# Patient Record
Sex: Male | Born: 1990 | Race: Black or African American | Hispanic: No | Marital: Single | Smoking: Never smoker
Health system: Southern US, Community
[De-identification: ages and names within clinical notes are randomized; demographics above are authoritative.]

## PROBLEM LIST (undated history)

## (undated) ENCOUNTER — Ambulatory Visit (HOSPITAL_COMMUNITY): Discharge status: Absent without leave | Payer: BC Managed Care – PPO

## (undated) ENCOUNTER — Ambulatory Visit (HOSPITAL_COMMUNITY): Source: Home / Self Care

## (undated) ENCOUNTER — Emergency Department (HOSPITAL_COMMUNITY): Payer: No Typology Code available for payment source

## (undated) DIAGNOSIS — I10 Essential (primary) hypertension: Secondary | ICD-10-CM

---
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
## 2009-03-09 ENCOUNTER — Emergency Department (HOSPITAL_COMMUNITY): Admission: EM | Admit: 2009-03-09 | Discharge: 2009-03-09 | Payer: Self-pay | Admitting: Emergency Medicine

## 2010-01-19 ENCOUNTER — Emergency Department (HOSPITAL_COMMUNITY): Admission: EM | Admit: 2010-01-19 | Discharge: 2010-01-19 | Payer: Self-pay | Admitting: Emergency Medicine

## 2010-01-26 ENCOUNTER — Emergency Department (HOSPITAL_COMMUNITY): Admission: EM | Admit: 2010-01-26 | Discharge: 2010-01-26 | Payer: Self-pay | Admitting: Emergency Medicine

## 2010-04-24 ENCOUNTER — Emergency Department (HOSPITAL_COMMUNITY): Admission: EM | Admit: 2010-04-24 | Discharge: 2010-04-24 | Payer: Self-pay | Admitting: Family Medicine

## 2010-06-29 ENCOUNTER — Emergency Department (HOSPITAL_COMMUNITY): Admission: EM | Admit: 2010-06-29 | Discharge: 2009-08-22 | Payer: Self-pay | Admitting: Emergency Medicine

## 2010-08-17 ENCOUNTER — Emergency Department (HOSPITAL_COMMUNITY)
Admission: EM | Admit: 2010-08-17 | Discharge: 2010-08-17 | Payer: Self-pay | Source: Home / Self Care | Admitting: Emergency Medicine

## 2010-08-28 ENCOUNTER — Emergency Department (HOSPITAL_COMMUNITY)
Admission: EM | Admit: 2010-08-28 | Discharge: 2010-08-28 | Disposition: A | Discharge status: Home / Self Care | Payer: Medicaid Other | Attending: Emergency Medicine | Admitting: Emergency Medicine

## 2010-08-28 DIAGNOSIS — J45909 Unspecified asthma, uncomplicated: Secondary | ICD-10-CM | POA: Insufficient documentation

## 2010-08-28 DIAGNOSIS — M25519 Pain in unspecified shoulder: Secondary | ICD-10-CM | POA: Insufficient documentation

## 2010-08-28 DIAGNOSIS — I10 Essential (primary) hypertension: Secondary | ICD-10-CM | POA: Insufficient documentation

## 2010-09-06 ENCOUNTER — Emergency Department (HOSPITAL_COMMUNITY)
Admission: EM | Admit: 2010-09-06 | Discharge: 2010-09-06 | Disposition: A | Discharge status: Home / Self Care | Payer: Medicaid Other | Attending: Emergency Medicine | Admitting: Emergency Medicine

## 2010-09-06 DIAGNOSIS — R079 Chest pain, unspecified: Secondary | ICD-10-CM | POA: Insufficient documentation

## 2010-09-06 DIAGNOSIS — I10 Essential (primary) hypertension: Secondary | ICD-10-CM | POA: Insufficient documentation

## 2010-09-06 DIAGNOSIS — M25519 Pain in unspecified shoulder: Secondary | ICD-10-CM | POA: Insufficient documentation

## 2010-09-14 ENCOUNTER — Emergency Department (HOSPITAL_BASED_OUTPATIENT_CLINIC_OR_DEPARTMENT_OTHER)
Admission: EM | Admit: 2010-09-14 | Discharge: 2010-09-15 | Disposition: A | Discharge status: Home / Self Care | Payer: Medicaid Other | Attending: Emergency Medicine | Admitting: Emergency Medicine

## 2010-09-14 DIAGNOSIS — Y929 Unspecified place or not applicable: Secondary | ICD-10-CM | POA: Insufficient documentation

## 2010-09-14 DIAGNOSIS — G8929 Other chronic pain: Secondary | ICD-10-CM | POA: Insufficient documentation

## 2010-09-14 DIAGNOSIS — M79609 Pain in unspecified limb: Secondary | ICD-10-CM | POA: Insufficient documentation

## 2010-09-14 DIAGNOSIS — W208XXA Other cause of strike by thrown, projected or falling object, initial encounter: Secondary | ICD-10-CM | POA: Insufficient documentation

## 2010-09-14 DIAGNOSIS — I10 Essential (primary) hypertension: Secondary | ICD-10-CM | POA: Insufficient documentation

## 2010-09-14 DIAGNOSIS — S6000XA Contusion of unspecified finger without damage to nail, initial encounter: Secondary | ICD-10-CM | POA: Insufficient documentation

## 2010-09-15 ENCOUNTER — Emergency Department (INDEPENDENT_AMBULATORY_CARE_PROVIDER_SITE_OTHER): Payer: Medicaid Other

## 2010-09-15 DIAGNOSIS — S6710XA Crushing injury of unspecified finger(s), initial encounter: Secondary | ICD-10-CM

## 2010-09-15 DIAGNOSIS — W208XXA Other cause of strike by thrown, projected or falling object, initial encounter: Secondary | ICD-10-CM

## 2010-09-19 ENCOUNTER — Emergency Department (HOSPITAL_COMMUNITY)
Admission: EM | Admit: 2010-09-19 | Discharge: 2010-09-19 | Disposition: A | Discharge status: Home / Self Care | Payer: Medicaid Other | Attending: Emergency Medicine | Admitting: Emergency Medicine

## 2010-09-19 DIAGNOSIS — Z87828 Personal history of other (healed) physical injury and trauma: Secondary | ICD-10-CM | POA: Insufficient documentation

## 2010-09-19 DIAGNOSIS — M25519 Pain in unspecified shoulder: Secondary | ICD-10-CM | POA: Insufficient documentation

## 2010-10-05 LAB — POCT RAPID STREP A (OFFICE): Streptococcus, Group A Screen (Direct): NEGATIVE

## 2010-10-08 LAB — CBC
HCT: 40.6 % (ref 39.0–52.0)
Hemoglobin: 13.8 g/dL (ref 13.0–17.0)
MCHC: 33.9 g/dL (ref 30.0–36.0)
MCV: 88.6 fL (ref 78.0–100.0)
Platelets: 193 10*3/uL (ref 150–400)
RBC: 4.59 MIL/uL (ref 4.22–5.81)
RDW: 12.3 % (ref 11.5–15.5)
WBC: 5.9 10*3/uL (ref 4.0–10.5)

## 2010-10-08 LAB — URINE MICROSCOPIC-ADD ON

## 2010-10-08 LAB — DIFFERENTIAL
Basophils Absolute: 0.1 10*3/uL (ref 0.0–0.1)
Basophils Relative: 1 % (ref 0–1)
Eosinophils Absolute: 0.2 10*3/uL (ref 0.0–0.7)
Eosinophils Relative: 3 % (ref 0–5)
Lymphocytes Relative: 47 % — ABNORMAL HIGH (ref 12–46)
Lymphs Abs: 2.7 10*3/uL (ref 0.7–4.0)
Monocytes Absolute: 0.5 10*3/uL (ref 0.1–1.0)
Monocytes Relative: 8 % (ref 3–12)
Neutro Abs: 2.4 10*3/uL (ref 1.7–7.7)
Neutrophils Relative %: 41 % — ABNORMAL LOW (ref 43–77)

## 2010-10-08 LAB — BASIC METABOLIC PANEL
BUN: 11 mg/dL (ref 6–23)
CO2: 26 mEq/L (ref 19–32)
Calcium: 8.6 mg/dL (ref 8.4–10.5)
Chloride: 106 mEq/L (ref 96–112)
Creatinine, Ser: 1.05 mg/dL (ref 0.4–1.5)
GFR calc Af Amer: >60 mL/min (ref >60)
GFR calc non Af Amer: >60 mL/min (ref >60)
Glucose, Bld: 86 mg/dL (ref 70–99)
Potassium: 3.4 mEq/L — ABNORMAL LOW (ref 3.5–5.1)
Sodium: 137 mEq/L (ref 135–145)

## 2010-10-08 LAB — URINALYSIS, ROUTINE W REFLEX MICROSCOPIC
Hgb urine dipstick: NEGATIVE
Protein, ur: NEGATIVE mg/dL
Urobilinogen, UA: 1 mg/dL (ref 0.0–1.0)

## 2010-10-08 LAB — GC/CHLAMYDIA PROBE AMP, URINE: GC Probe Amp, Urine: NEGATIVE

## 2010-10-24 ENCOUNTER — Emergency Department (HOSPITAL_COMMUNITY): Payer: Self-pay

## 2010-10-24 ENCOUNTER — Emergency Department (HOSPITAL_COMMUNITY)
Admission: EM | Admit: 2010-10-24 | Discharge: 2010-10-24 | Disposition: A | Discharge status: Home / Self Care | Payer: Self-pay | Attending: Emergency Medicine | Admitting: Emergency Medicine

## 2010-10-24 DIAGNOSIS — R059 Cough, unspecified: Secondary | ICD-10-CM | POA: Insufficient documentation

## 2010-10-24 DIAGNOSIS — J069 Acute upper respiratory infection, unspecified: Secondary | ICD-10-CM | POA: Insufficient documentation

## 2010-10-24 DIAGNOSIS — R05 Cough: Secondary | ICD-10-CM | POA: Insufficient documentation

## 2010-11-01 ENCOUNTER — Emergency Department (HOSPITAL_COMMUNITY): Payer: Self-pay

## 2010-11-01 ENCOUNTER — Emergency Department (HOSPITAL_COMMUNITY)
Admission: EM | Admit: 2010-11-01 | Discharge: 2010-11-01 | Disposition: A | Discharge status: Home / Self Care | Payer: Self-pay | Attending: Emergency Medicine | Admitting: Emergency Medicine

## 2010-11-01 DIAGNOSIS — R05 Cough: Secondary | ICD-10-CM | POA: Insufficient documentation

## 2010-11-01 DIAGNOSIS — R059 Cough, unspecified: Secondary | ICD-10-CM | POA: Insufficient documentation

## 2010-11-01 DIAGNOSIS — R079 Chest pain, unspecified: Secondary | ICD-10-CM | POA: Insufficient documentation

## 2010-11-01 DIAGNOSIS — J069 Acute upper respiratory infection, unspecified: Secondary | ICD-10-CM | POA: Insufficient documentation

## 2010-11-01 DIAGNOSIS — I10 Essential (primary) hypertension: Secondary | ICD-10-CM | POA: Insufficient documentation

## 2010-12-05 NOTE — Consult Note (Signed)
[name], [name]              ACCOUNT NO.:  [id number]   MEDICAL RECORD NO.:  [id number]          PATIENT TYPE:  INP   LOCATION:  6126                         FACILITY:  [facility]   PHYSICIAN:  Antony Contras, MD     DATE OF BIRTH:  [birth year]   DATE OF CONSULTATION:  07/31/2007  DATE OF DISCHARGE:                                 CONSULTATION   CHIEF COMPLAINT:  Neck mass.   HISTORY OF PRESENT ILLNESS:  The patient is a 20 year old African  American male who has a 4-week history of increasing mass in the  submental region.  Initially it did not hurt but now it hurts some with  chewing.  He has had daily headaches during this time as well.  He came  to the emergency department 2 days ago and was prescribed clindamycin.  He returned yesterday because of fever to 102.2 degrees Fahrenheit as  well as some nausea and vomiting.  A head CT and neck CT were performed  and he was admitted to the pediatric service on IV clindamycin.  This  morning, he is without complaint and his headaches are better.   PAST MEDICAL HISTORY:  1. Hypertension.  2. Migraines.   FAMILY HISTORY:  Hypertension.   SOCIAL HISTORY:  The patient lives with his girlfriend and family.  He  denies alcohol, tobacco, drugs.   MEDICATIONS:  1. Clindamycin.  2. Ibuprofen.   ALLERGIES:  NO KNOWN DRUG ALLERGIES.   REVIEW OF SYSTEMS:  Negative except as listed above.   PHYSICAL EXAM:  Temperature max at 6:00 a.m. was 39.4 degrees Celsius  and current is 37 degrees Celsius.  Vital signs stable.  GENERAL:  The patient is in no acute distress and is pleasant and  cooperative.  He is alert.  HEAD AND FACE:  There are no abnormalities.  EARS:  External ears normal.  External canals are patent.  Tympanic  membranes are intact.  Middle ear spaces are aerated.  NOSE:  External nose is normal.  Nasal passages are patent.  Turbinates  are mildly enlarged.  Septum is relatively midline.  ORAL CAVITY:  The patient has normal  lips, teeth and gums.  The tonsils  are 1-2+.  The tongue is normal.  The floor of mouth is soft and normal.  CRANIAL NERVES:  II-XII are grossly intact.  NECK:  There is a 2 x 2 cm round, firm, nontender, mobile mass in the  midline submental region.  There is no other mass and other neck  landmarks are normal.  LYMPHATICS:  There are no enlarged lymph nodes besides the submental  mass.  THYROID: Thyroid is normal palpation.   LABORATORY DATA:  White blood count is 10.6.   RADIOLOGIC EXAM:  Neck CT with contrast performed yesterday was  personally reviewed.  This demonstrates a partially solid and possibly  partially fluid filled mass in the submental region is that is ill-  defined.  There is not a lot of inflammation surrounding it.  The rest  of the neck is normal.   ASSESSMENT:  The patient is a 20 year old African  American male with a  submental mass that is nontender and without signs of inflammation.  However, he has had fevers and has a little bit of discomfort with  chewing.   PLAN:  I discussed the plan with the pediatric service.  I recommended  continuing intravenous clindamycin until he is afebrile.  I would then  recommend an oral course of clindamycin.  If the mass persists in spite  of antibiotic therapy, biopsy will be performed most likely with a fine-  needle aspiration.  Excision may be needed for final pathology.  If the  mass shows more signs of inflammation over the next couple of days, a  repeat CT may be helpful to establish whether an abscess has formed.  Currently, he does not seem to have an abscess..  Follow-up will be with  me in about a week after discharge.      Antony Contras, MD  Electronically Signed     DDB/MEDQ  D:  07/31/2007  T:  07/31/2007  Job:  [id number]

## 2010-12-05 NOTE — Discharge Summary (Signed)
[name], [name]              ACCOUNT NO.:  [id number]   MEDICAL RECORD NO.:  [id number]          PATIENT TYPE:  INP   LOCATION:  6126                         FACILITY:  [facility]   PHYSICIAN:  Orie Rout, M.D.DATE OF BIRTH:  10/03/1990   DATE OF ADMISSION:  07/30/2007  DATE OF DISCHARGE:  08/02/2007                               DISCHARGE SUMMARY   REASON FOR HOSPITALIZATION:  Neck mass.   SIGNIFICANT PHYSICAL FINDINGS:  Head and neck CT on July 31, 2007,  showed a 2 x 2 cm polycystic partly solid mass in the submental region.  No other lymphadenopathy.  CBC showed a white count of 10.6 with 74%  neutrophils and 13% lymphocytes.  The patient was febrile on hospital  day #1 at 39.4 and has been afebrile while on clindamycin.  Treatment  included clindamycin  IV x48 hours, Tylenol  for pain and fever.  The  patient as switched to oral  clindamycin upon discharge.  The patient is  to follow up with Dr. Jenne Pane, who is an ENT specialist, on August 08, 2007, with consideration of a fine-needle aspiration if there is no  improvement in the mass.   OPERATION/PROCEDURE:  CAT scan.   FINAL DIAGNOSES:  Neck mass, still no specific diagnosis for the type of  neck mass.   DISCHARGE MEDICATIONS AND INSTRUCTIONS:  The patient was sent home on  clindamycin 450 mg p.o. t.i.d. x12 days.  The patient was instructed to  keep all of his followup appointments.   PENDING RESULTS OR ISSUES TO BE FOLLOWED UP:  None.   FOLLOWUP:  1. With Dr. Chilton Si, his dentist, on August 18, 2007, at 8:30 a.m.  2. With Dr. Jenne Pane, Ears, Nose, and Throat specialist, on August 08, 2007, at 12:45 p.m.  3. With Dr. Donnie Coffin, his pediatrician, on August 05, 2007, at 12:30      p.m.   DISCHARGE WEIGHT:  81 kg.   CONDITION ON DISCHARGE:  Stable.  This was faxed to primary care  physician, Dr. Donnie Coffin, at fax number [phone] on August 02, 2007.  This  was faxed to Dr. Jenne Pane at fax number [phone] on  August 02, 2007.      Asher Muir, MD  Electronically Signed      Orie Rout, M.D.  Electronically Signed    SO/MEDQ  D:  08/02/2007  T:  08/03/2007  Job:  [id number]   cc:   Jefferey Pica, MD  Antony Contras, MD

## 2010-12-30 ENCOUNTER — Emergency Department (HOSPITAL_COMMUNITY)
Admission: EM | Admit: 2010-12-30 | Discharge: 2010-12-30 | Discharge status: Against Medical Advice | Payer: Self-pay | Attending: Emergency Medicine | Admitting: Emergency Medicine

## 2010-12-30 DIAGNOSIS — R51 Headache: Secondary | ICD-10-CM | POA: Insufficient documentation

## 2010-12-30 DIAGNOSIS — R42 Dizziness and giddiness: Secondary | ICD-10-CM | POA: Insufficient documentation

## 2011-01-09 ENCOUNTER — Emergency Department (HOSPITAL_COMMUNITY)
Admission: EM | Admit: 2011-01-09 | Discharge: 2011-01-09 | Disposition: A | Discharge status: Home / Self Care | Payer: Self-pay | Attending: Emergency Medicine | Admitting: Emergency Medicine

## 2011-01-09 DIAGNOSIS — R51 Headache: Secondary | ICD-10-CM | POA: Insufficient documentation

## 2011-01-09 DIAGNOSIS — H53149 Visual discomfort, unspecified: Secondary | ICD-10-CM | POA: Insufficient documentation

## 2011-03-07 ENCOUNTER — Emergency Department (HOSPITAL_COMMUNITY)
Admission: EM | Admit: 2011-03-07 | Discharge: 2011-03-07 | Disposition: A | Discharge status: Home / Self Care | Payer: Self-pay | Attending: Emergency Medicine | Admitting: Emergency Medicine

## 2011-03-07 DIAGNOSIS — S61209A Unspecified open wound of unspecified finger without damage to nail, initial encounter: Secondary | ICD-10-CM | POA: Insufficient documentation

## 2011-03-07 DIAGNOSIS — W260XXA Contact with knife, initial encounter: Secondary | ICD-10-CM | POA: Insufficient documentation

## 2011-03-23 ENCOUNTER — Encounter: Payer: Self-pay | Admitting: Family Medicine

## 2011-03-23 ENCOUNTER — Emergency Department (HOSPITAL_BASED_OUTPATIENT_CLINIC_OR_DEPARTMENT_OTHER)
Admission: EM | Admit: 2011-03-23 | Discharge: 2011-03-23 | Disposition: A | Discharge status: Home / Self Care | Payer: No Typology Code available for payment source | Attending: Emergency Medicine | Admitting: Emergency Medicine

## 2011-03-23 ENCOUNTER — Emergency Department (INDEPENDENT_AMBULATORY_CARE_PROVIDER_SITE_OTHER): Payer: No Typology Code available for payment source

## 2011-03-23 DIAGNOSIS — Y92410 Unspecified street and highway as the place of occurrence of the external cause: Secondary | ICD-10-CM | POA: Insufficient documentation

## 2011-03-23 DIAGNOSIS — M542 Cervicalgia: Secondary | ICD-10-CM

## 2011-03-23 DIAGNOSIS — S139XXA Sprain of joints and ligaments of unspecified parts of neck, initial encounter: Secondary | ICD-10-CM | POA: Insufficient documentation

## 2011-03-23 DIAGNOSIS — I10 Essential (primary) hypertension: Secondary | ICD-10-CM | POA: Insufficient documentation

## 2011-03-23 HISTORY — DX: Essential (primary) hypertension: I10

## 2011-03-23 MED ORDER — HYDROCODONE-ACETAMINOPHEN 5-325 MG PO TABS: 2.0000 | ORAL_TABLET | ORAL | Status: AC | PRN
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
## 2011-03-23 NOTE — ED Provider Notes (Signed)
Medical screening examination/treatment/procedure(s) were performed by non-physician practitioner and as supervising physician I was immediately available for consultation/collaboration.   Forbes Cellar, MD 03/23/11 [phone]
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
## 2011-03-28 ENCOUNTER — Encounter (HOSPITAL_BASED_OUTPATIENT_CLINIC_OR_DEPARTMENT_OTHER): Payer: Self-pay | Admitting: *Deleted

## 2011-04-12 LAB — DIFFERENTIAL
Basophils Relative: 0
Lymphocytes Relative: 13 — ABNORMAL LOW
Lymphs Abs: 1.4
Monocytes Absolute: 1.3 — ABNORMAL HIGH
Monocytes Relative: 12 — ABNORMAL HIGH
Neutro Abs: 7.8

## 2011-04-12 LAB — CULTURE, BLOOD (ROUTINE X 2): Culture: NO GROWTH

## 2011-04-12 LAB — CBC
Hemoglobin: 14.8
MCHC: 34.2
RBC: 4.95

## 2011-04-24 ENCOUNTER — Emergency Department (HOSPITAL_COMMUNITY)
Admission: EM | Admit: 2011-04-24 | Discharge: 2011-04-24 | Disposition: A | Discharge status: Home / Self Care | Payer: No Typology Code available for payment source | Attending: Emergency Medicine | Admitting: Emergency Medicine

## 2011-04-24 ENCOUNTER — Emergency Department (HOSPITAL_COMMUNITY): Payer: No Typology Code available for payment source

## 2011-04-24 DIAGNOSIS — M546 Pain in thoracic spine: Secondary | ICD-10-CM | POA: Insufficient documentation

## 2011-04-24 DIAGNOSIS — R0789 Other chest pain: Secondary | ICD-10-CM | POA: Insufficient documentation

## 2011-04-24 DIAGNOSIS — I10 Essential (primary) hypertension: Secondary | ICD-10-CM | POA: Insufficient documentation

## 2011-07-07 ENCOUNTER — Encounter (HOSPITAL_COMMUNITY): Payer: Self-pay | Admitting: Emergency Medicine

## 2011-07-07 ENCOUNTER — Emergency Department (HOSPITAL_COMMUNITY)
Admission: EM | Admit: 2011-07-07 | Discharge: 2011-07-07 | Disposition: A | Discharge status: Home / Self Care | Payer: Self-pay | Attending: Emergency Medicine | Admitting: Emergency Medicine

## 2011-07-07 DIAGNOSIS — R369 Urethral discharge, unspecified: Secondary | ICD-10-CM | POA: Insufficient documentation

## 2011-07-07 DIAGNOSIS — N342 Other urethritis: Secondary | ICD-10-CM | POA: Insufficient documentation

## 2011-07-07 MED ORDER — CEFTRIAXONE SODIUM 250 MG IJ SOLR
250.0000 mg | Freq: Once | INTRAMUSCULAR | Status: AC
  Administered 2011-07-07: 250 mg via INTRAMUSCULAR
  Filled 2011-07-07: qty 250

## 2011-07-07 MED ORDER — AZITHROMYCIN 250 MG PO TABS
1000.0000 mg | ORAL_TABLET | Freq: Once | ORAL | Status: AC
  Administered 2011-07-07: 1000 mg via ORAL
  Filled 2011-07-07: qty 4

## 2011-07-07 NOTE — ED Notes (Signed)
Pt is c/o pain in his right side right below his right rib area States pain is like a spasm type pain  Pt states he states he had difficulty urinating earlier but that has resolved now  Pt denies N/V/D  Pt states he also wants to be checked for a STD
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
## 2011-07-10 LAB — GC/CHLAMYDIA PROBE AMP, GENITAL
Chlamydia, DNA Probe: NEGATIVE
GC Probe Amp, Genital: POSITIVE — AB

## 2011-07-12 NOTE — ED Notes (Signed)
+   Gonorrhea Patient treated with rocephin and Zithromax.[facility] letter faxed

## 2011-07-16 NOTE — ED Notes (Signed)
Unable to contact via phone ,letter sent to [facility] address. 

## 2012-01-13 IMAGING — CR DG CHEST 2V
2 series · 2 of 2 positions shown · non-contrast
Comparison: Chest radiograph performed 10/24/2010

CLINICAL DATA: Chest pain and cough.

CHEST - 2 VIEW

[w chest pa]
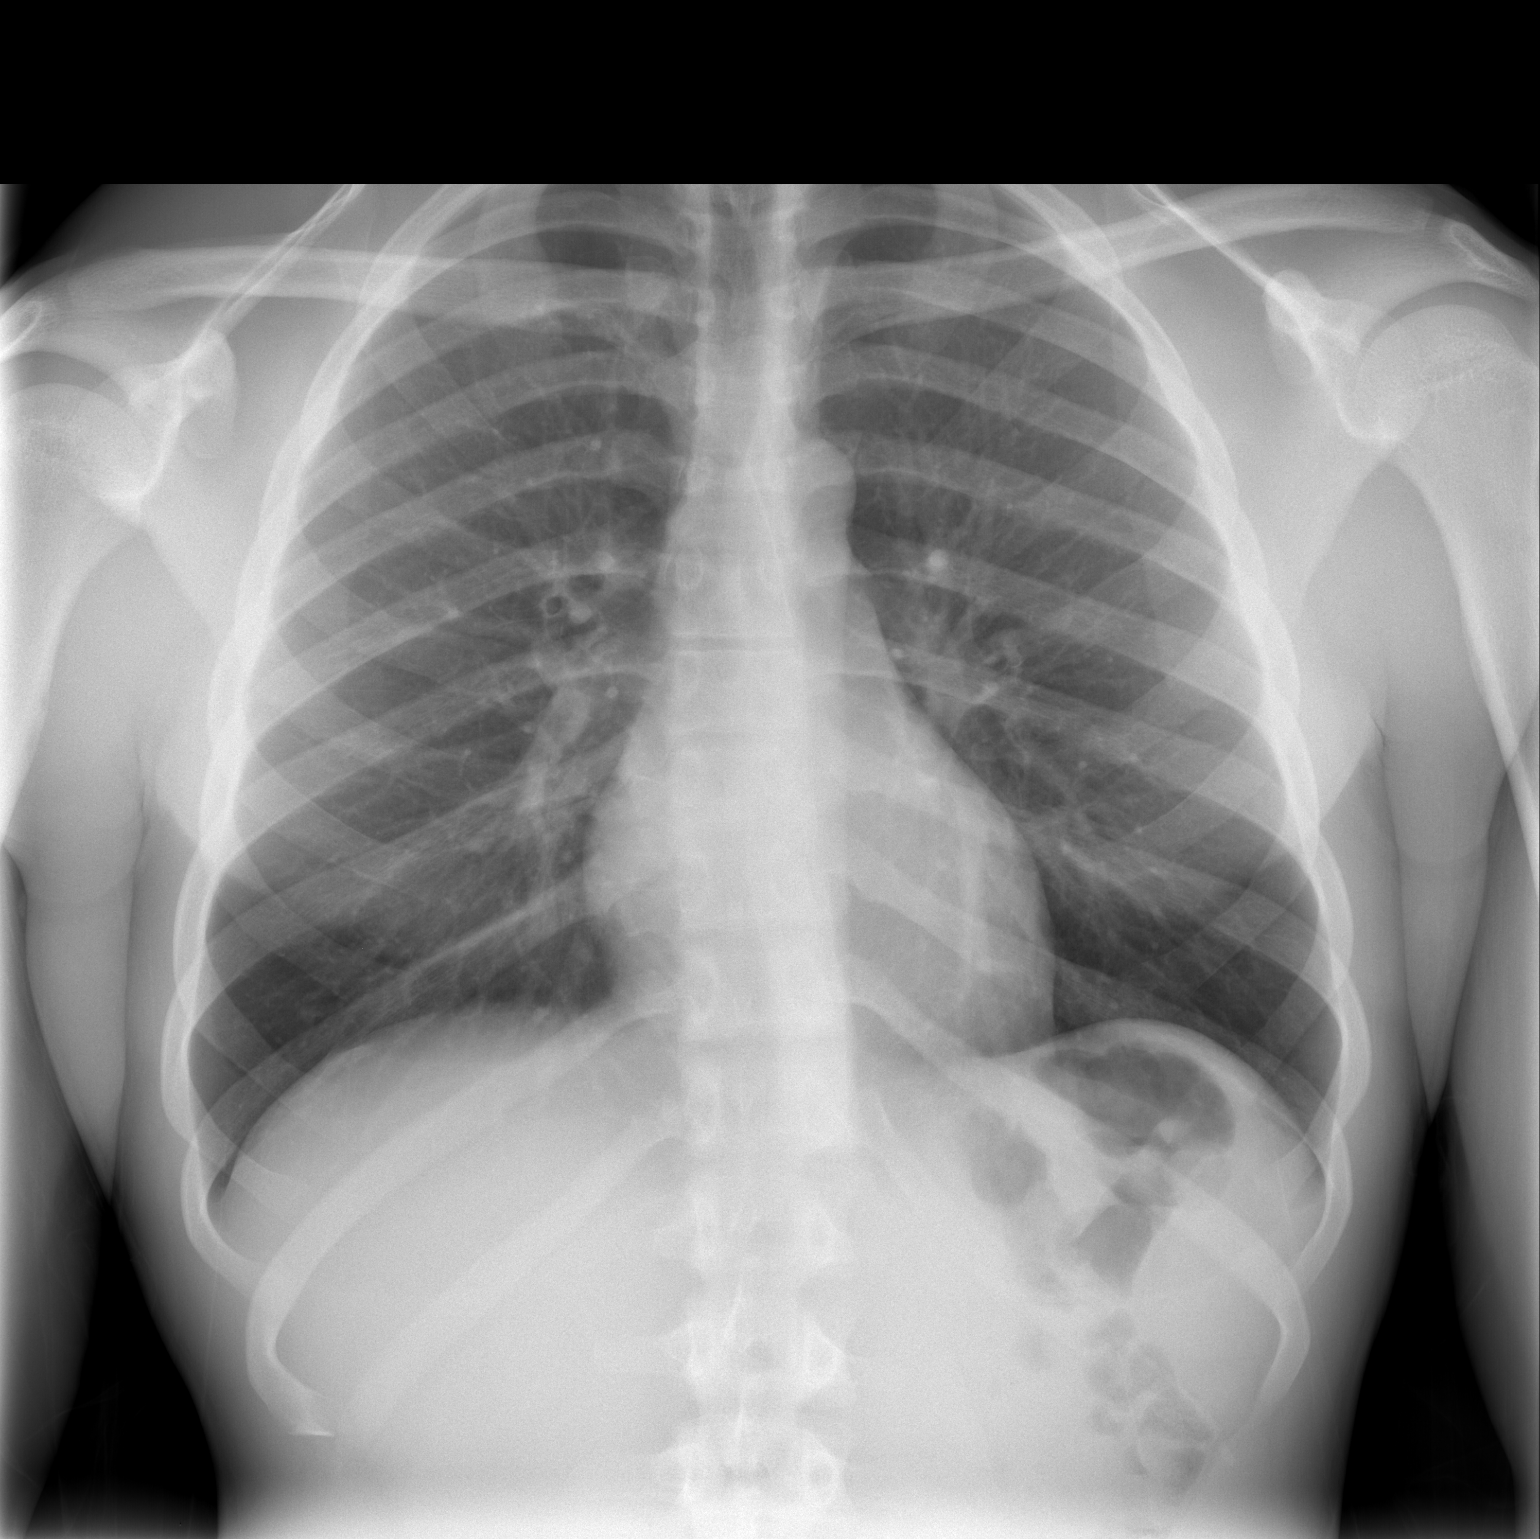

[w chest lat]
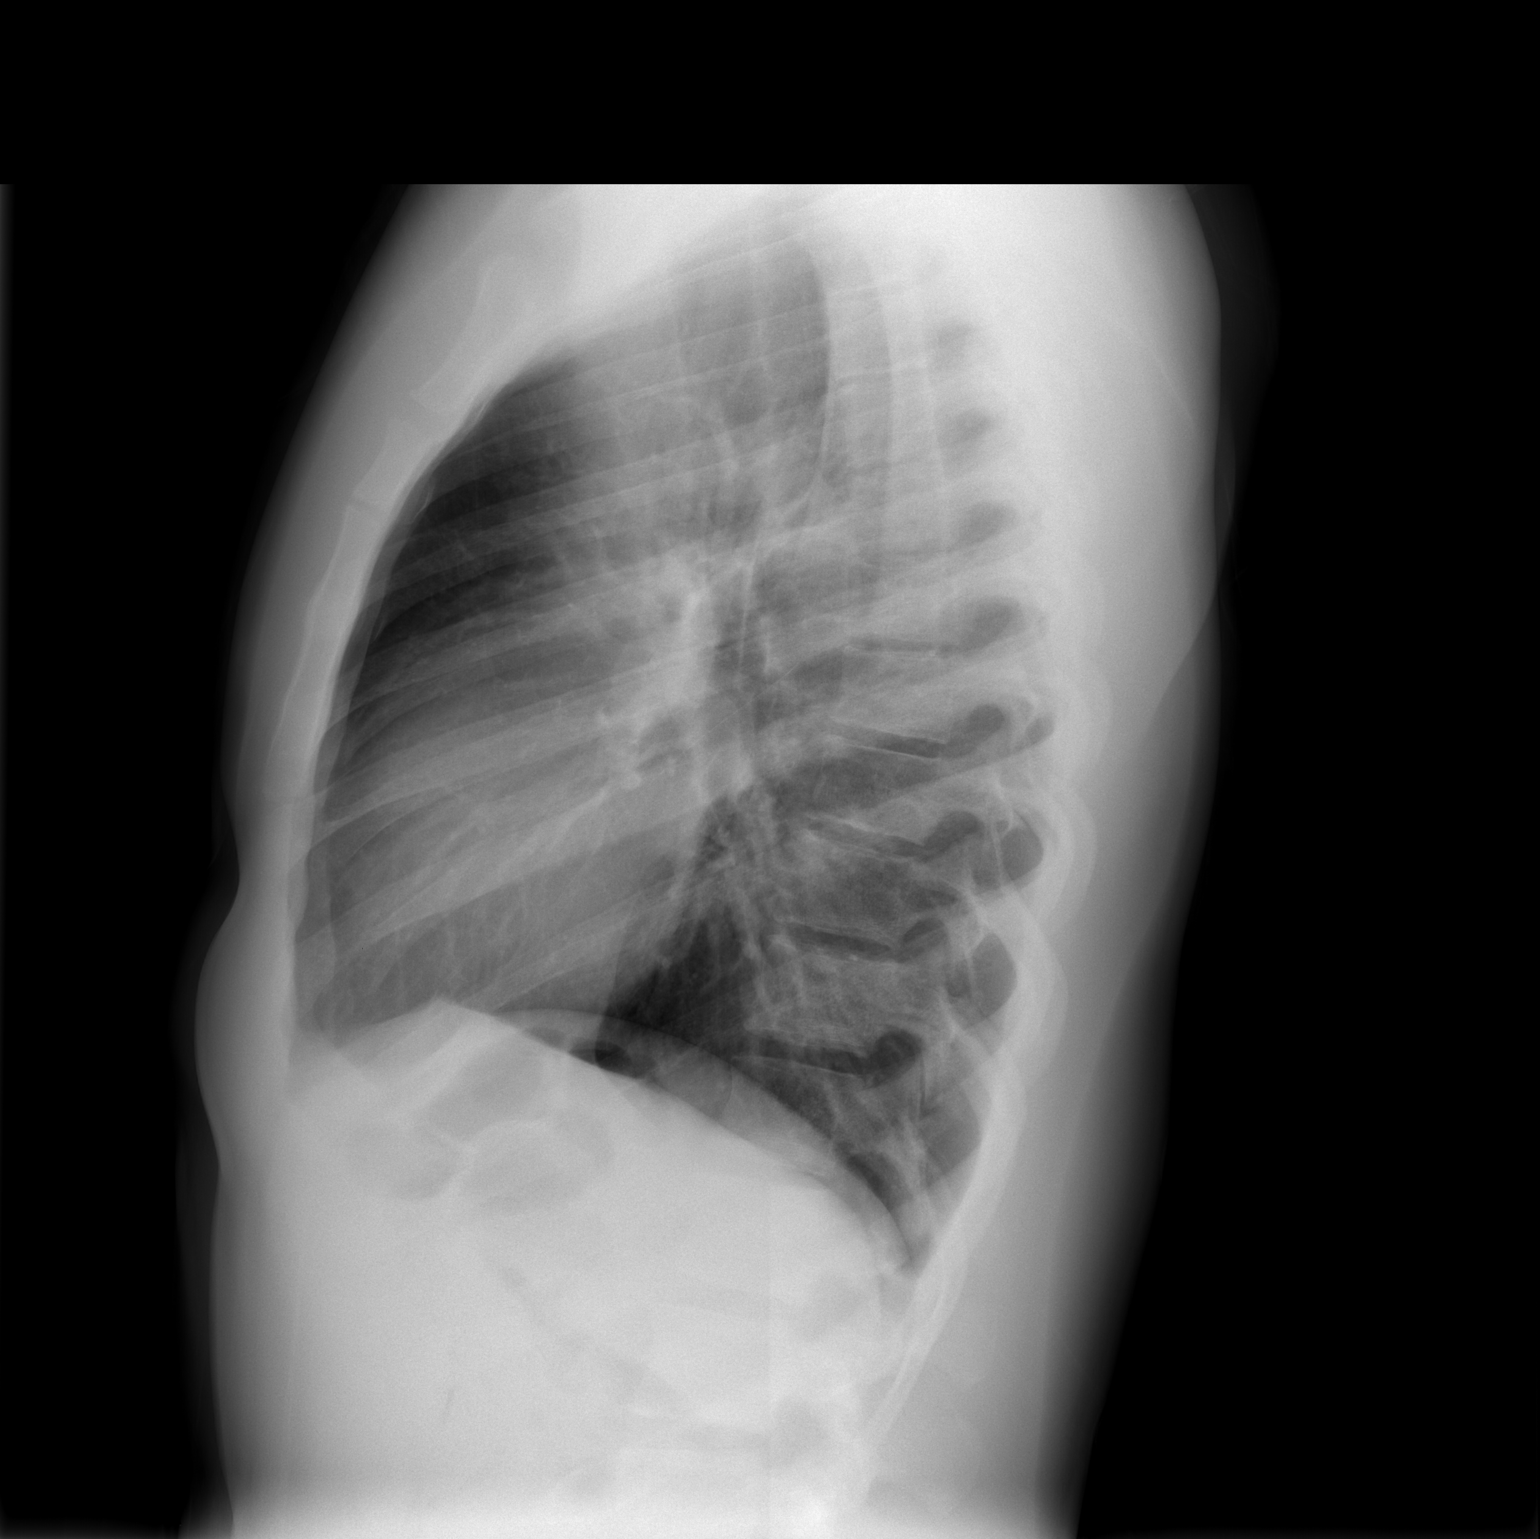

[2 of 2 positions shown; findings below may reference images not displayed]

FINDINGS: The lungs are well-aerated and clear.  There is no
evidence of focal opacification, pleural effusion or pneumothorax.

The heart is normal in size; the mediastinal contour is within
normal limits.  No acute osseous abnormalities are seen.
IMPRESSION: No acute cardiopulmonary process seen.

## 2012-06-09 ENCOUNTER — Encounter (HOSPITAL_COMMUNITY): Payer: Self-pay | Admitting: Emergency Medicine

## 2012-06-09 ENCOUNTER — Emergency Department (HOSPITAL_COMMUNITY)
Admission: EM | Admit: 2012-06-09 | Discharge: 2012-06-09 | Disposition: A | Discharge status: Home / Self Care | Payer: Self-pay | Attending: Emergency Medicine | Admitting: Emergency Medicine

## 2012-06-09 DIAGNOSIS — I10 Essential (primary) hypertension: Secondary | ICD-10-CM | POA: Insufficient documentation

## 2012-06-09 DIAGNOSIS — R040 Epistaxis: Secondary | ICD-10-CM | POA: Insufficient documentation

## 2012-06-09 MED ORDER — OXYMETAZOLINE HCL 0.05 % NA SOLN
1.0000 | Freq: Once | NASAL | Status: AC
  Administered 2012-06-09: 1 via NASAL
  Filled 2012-06-09: qty 15

## 2012-06-09 NOTE — ED Notes (Signed)
Pt presenting to ed with c/o nosebleed x 1 week pt states intermittently. Pt states positive headaches. Pt denies nausea, vomiting and blurred vision

## 2012-06-09 NOTE — ED Provider Notes (Signed)
History     CSN: [id number]  Arrival date & time 06/09/12  0944   First MD Initiated Contact with Patient 06/09/12 1013      Chief Complaint  Patient presents with  . Epistaxis    (Consider location/radiation/quality/duration/timing/severity/associated sxs/prior treatment) Patient is a 21 y.o. male presenting with nosebleeds. The history is provided by the patient.  Epistaxis  This is a new problem. Episode onset: 1 week, intermittent  The problem occurs daily. The problem has been resolved. Associated with: cold like s/s, since resolved. The bleeding has been from the right nare. He has tried nothing for the symptoms. His past medical history is significant for colds. His past medical history does not include bleeding disorder, sinus problems, allergies, nose-picking or frequent nosebleeds.    Past Medical History  Diagnosis Date  . Hypertension     History reviewed. No pertinent past surgical history.  Family History  Problem Relation Age of Onset  . Hypertension Brother   . Hypertension Other     History  Substance Use Topics  . Smoking status: Never Smoker   . Smokeless tobacco: Not on file  . Alcohol Use: No      Review of Systems  Constitutional: Negative for fever, diaphoresis and activity change.  HENT: Positive for nosebleeds. Negative for congestion and neck pain.   Respiratory: Negative for cough.   Genitourinary: Negative for dysuria.  Musculoskeletal: Negative for myalgias.  Skin: Negative for color change and wound.  Neurological: Negative for headaches.  All other systems reviewed and are negative.    Allergies  Review of patient's allergies indicates no known allergies.  Home Medications   Current Outpatient Rx  Name  Route  Sig  Dispense  Refill  . NYQUIL PO   Oral   Take 30 mLs by mouth 3 (three) times daily as needed. For cold symptoms           BP 132/81  Pulse 59  Temp 98.1 F (36.7 C) (Oral)  Resp 16  SpO2  100%  Physical Exam  Nursing note and vitals reviewed. Constitutional: He is oriented to person, place, and time. He appears well-developed and well-nourished. No distress.  HENT:  Head: Normocephalic and atraumatic.  Nose: Nose normal. No rhinorrhea or sinus tenderness. No epistaxis (past, clotted).  Eyes: Conjunctivae normal and EOM are normal.  Neck: Normal range of motion.  Pulmonary/Chest: Effort normal.  Musculoskeletal: Normal range of motion.  Neurological: He is alert and oriented to person, place, and time.  Skin: Skin is warm and dry. No rash noted. He is not diaphoretic.  Psychiatric: He has a normal mood and affect. His behavior is normal.    ED Course  Procedures (including critical care time)  Labs Reviewed - No data to display No results found.   No diagnosis found.    MDM  Epistaxis, resolved  At this time there does not appear to be any evidence of an acute emergency medical condition and the patient appears stable for discharge with appropriate outpatient follow up.Diagnosis was discussed with patient who verbalizes understanding and is agreeable to discharge.        Jaci Carrel, [place] 06/09/12 1053

## 2012-06-09 NOTE — ED Notes (Signed)
Pt last nose bleed was 1 hour ago. Mother states this usually happens when pt blood pressure goes up. Pt was told by MD he has high blood pressure but he does not take medication for it.

## 2012-06-09 NOTE — ED Provider Notes (Signed)
History/physical exam/procedure(s) were performed by non-physician practitioner and as supervising physician I was immediately available for consultation/collaboration. I have reviewed all notes and am in agreement with care and plan.   Hilario Quarry, MD 06/09/12 [phone]

## 2012-12-07 ENCOUNTER — Emergency Department (HOSPITAL_COMMUNITY)
Admission: EM | Admit: 2012-12-07 | Discharge: 2012-12-07 | Disposition: A | Discharge status: Home / Self Care | Payer: Self-pay | Attending: Emergency Medicine | Admitting: Emergency Medicine

## 2012-12-07 ENCOUNTER — Encounter (HOSPITAL_COMMUNITY): Payer: Self-pay | Admitting: Emergency Medicine

## 2012-12-07 DIAGNOSIS — R51 Headache: Secondary | ICD-10-CM | POA: Insufficient documentation

## 2012-12-07 DIAGNOSIS — H5711 Ocular pain, right eye: Secondary | ICD-10-CM

## 2012-12-07 DIAGNOSIS — S0510XA Contusion of eyeball and orbital tissues, unspecified eye, initial encounter: Secondary | ICD-10-CM | POA: Insufficient documentation

## 2012-12-07 MED ORDER — PROPARACAINE HCL 0.5 % OP SOLN
1.0000 [drp] | Freq: Once | OPHTHALMIC | Status: AC
  Administered 2012-12-07: 1 [drp] via OPHTHALMIC

## 2012-12-07 MED ORDER — HYDROCODONE-ACETAMINOPHEN 5-325 MG PO TABS: 1.0000 | ORAL_TABLET | ORAL | Status: DC | PRN

## 2012-12-07 MED ORDER — PROPARACAINE HCL 0.5 % OP SOLN
1.0000 [drp] | Freq: Once | OPHTHALMIC | Status: DC
  Filled 2012-12-07: qty 15

## 2012-12-07 MED ORDER — FLUORESCEIN SODIUM 1 MG OP STRP
1.0000 | ORAL_STRIP | Freq: Once | OPHTHALMIC | Status: AC
  Administered 2012-12-07: 17:00:00 via OPHTHALMIC
  Filled 2012-12-07: qty 1

## 2012-12-07 MED ORDER — TOBRAMYCIN 0.3 % OP SOLN
2.0000 [drp] | OPHTHALMIC | Status: DC
  Administered 2012-12-07: 2 [drp] via OPHTHALMIC
  Filled 2012-12-07: qty 5
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
## 2012-12-07 NOTE — ED Notes (Signed)
Discharge instructions reviewed w/ pt., verbalizes understanding. One prescription provided at discharge. Tobrex Eye gtts given to pt for home use as well.

## 2012-12-08 NOTE — ED Provider Notes (Signed)
Medical screening examination/treatment/procedure(s) were performed by non-physician practitioner and as supervising physician I was immediately available for consultation/collaboration.  Eleno Weimar, MD 12/08/12 0003 

## 2013-08-17 ENCOUNTER — Encounter (HOSPITAL_COMMUNITY): Payer: Self-pay | Admitting: Emergency Medicine

## 2013-08-17 ENCOUNTER — Emergency Department (HOSPITAL_COMMUNITY): Payer: Self-pay

## 2013-08-17 ENCOUNTER — Emergency Department (HOSPITAL_COMMUNITY)
Admission: EM | Admit: 2013-08-17 | Discharge: 2013-08-17 | Disposition: A | Discharge status: Home / Self Care | Payer: Self-pay | Attending: Emergency Medicine | Admitting: Emergency Medicine

## 2013-08-17 DIAGNOSIS — S8990XA Unspecified injury of unspecified lower leg, initial encounter: Secondary | ICD-10-CM | POA: Insufficient documentation

## 2013-08-17 DIAGNOSIS — I10 Essential (primary) hypertension: Secondary | ICD-10-CM | POA: Insufficient documentation

## 2013-08-17 DIAGNOSIS — X500XXA Overexertion from strenuous movement or load, initial encounter: Secondary | ICD-10-CM | POA: Insufficient documentation

## 2013-08-17 DIAGNOSIS — Y9239 Other specified sports and athletic area as the place of occurrence of the external cause: Secondary | ICD-10-CM | POA: Insufficient documentation

## 2013-08-17 DIAGNOSIS — S99929A Unspecified injury of unspecified foot, initial encounter: Principal | ICD-10-CM

## 2013-08-17 DIAGNOSIS — Y92838 Other recreation area as the place of occurrence of the external cause: Secondary | ICD-10-CM

## 2013-08-17 DIAGNOSIS — S99919A Unspecified injury of unspecified ankle, initial encounter: Principal | ICD-10-CM

## 2013-08-17 DIAGNOSIS — Y9367 Activity, basketball: Secondary | ICD-10-CM | POA: Insufficient documentation

## 2013-08-17 DIAGNOSIS — S99911A Unspecified injury of right ankle, initial encounter: Secondary | ICD-10-CM

## 2013-08-17 MED ORDER — NAPROXEN 500 MG PO TABS: 500.0000 mg | ORAL_TABLET | Freq: Two times a day (BID) | ORAL | Status: DC

## 2013-08-17 NOTE — Discharge Instructions (Signed)
Take Naprosyn as needed for pain. Rest, ice and elevate your ankle. Wear ankle brace as needed for support. Refer to attached documents for more information.

## 2013-08-17 NOTE — ED Provider Notes (Signed)
CSN: [id number]     Arrival date & time 08/17/13  1832 History  This chart was scribed for non-physician practitioner, Emilia BeckKaitlyn Jessamyn Watterson, PA-C working with Gavin PoundMichael Y. Oletta LamasGhim, MD by Greggory StallionKayla Andersen, ED scribe. This patient was seen in room TR10C/TR10C and the patient's care was started at 7:36 PM.   Chief Complaint  Patient presents with  . Ankle Pain   The history is provided by the patient. No language interpreter was used.   HPI Comments: [name] is a 23 y.o. male who presents to the Emergency Department complaining of right ankle injury that occurred last night. Pt was playing basketball and landed on his ankle wrong. He has sudden onset, constant, aching right ankle pain with associated swelling. Bearing weight worsens the pain.   Past Medical History  Diagnosis Date  . Hypertension    History reviewed. No pertinent past surgical history. Family History  Problem Relation Age of Onset  . Hypertension Brother   . Hypertension Other    History  Substance Use Topics  . Smoking status: Never Smoker   . Smokeless tobacco: Never Used  . Alcohol Use: No    Review of Systems  Musculoskeletal: Positive for arthralgias and joint swelling.  All other systems reviewed and are negative.   Allergies  Review of patient's allergies indicates no known allergies.  Home Medications  No current outpatient prescriptions on file.  BP 126/71  Pulse 65  Temp(Src) 98 F (36.7 C) (Oral)  Resp 16  SpO2 98%  Physical Exam  Nursing note and vitals reviewed. Constitutional: He is oriented to person, place, and time. He appears well-developed and well-nourished. No distress.  HENT:  Head: Normocephalic and atraumatic.  Eyes: EOM are normal.  Neck: Neck supple. No tracheal deviation present.  Cardiovascular: Normal rate.   Pulmonary/Chest: Effort normal. No respiratory distress.  Musculoskeletal: Normal range of motion.  Medial malleolar tenderness and swelling of right ankle.  Limited ROM due to pain and swelling.   Neurological: He is alert and oriented to person, place, and time.  Skin: Skin is warm and dry.  Psychiatric: He has a normal mood and affect. His behavior is normal.    ED Course  Procedures (including critical care time)  DIAGNOSTIC STUDIES: Oxygen Saturation is 98% on RA, normal by my interpretation.    COORDINATION OF CARE: 7:36 PM-Discussed treatment plan which includes an ankle brace, elevation, ice and pain medication with pt at bedside and pt agreed to plan.   Labs Review Labs Reviewed - No data to display Imaging Review Dg Ankle Complete Right  08/17/2013   CLINICAL DATA:  Injury, pain and swelling  EXAM: RIGHT ANKLE - COMPLETE 3+ VIEW  COMPARISON:  None.  FINDINGS: Diffuse ankle soft tissue swelling. Normal alignment without acute fracture. Malleoli, talus and calcaneus appear intact.  IMPRESSION: Soft tissue swelling.  No acute osseous finding   Electronically Signed   By: Ruel Favorsrevor  Shick M.D.   On: 08/17/2013 19:15    EKG Interpretation   None       MDM   1. Right ankle injury    7:43 PM Xray unremarkable for acute changes. Patient likely has right ankle sprain. Patient will have Naprosyn for pain and ASO ankle brace. Patient instructed to rest, ice and elevate right ankle. No other injuries.   I personally performed the services described in this documentation, which was scribed in my presence. The recorded information has been reviewed and is accurate.   Emilia BeckKaitlyn Sawsan Riggio, PA-C 08/17/13  1944 

## 2013-08-17 NOTE — ED Notes (Signed)
Pt playing basketball last night and landed wrong on right ankle. Pain to outside of ankle, worse with weight bearing.

## 2013-08-17 NOTE — ED Notes (Signed)
Called pt for room X 3, no answer.

## 2013-08-17 NOTE — ED Notes (Signed)
Called radiology, pt is in there, will bring him back.

## 2013-08-17 NOTE — ED Notes (Signed)
Pt given crutches per his request, fitted and instructions given, return demonstration completed well.

## 2013-08-18 NOTE — ED Provider Notes (Signed)
Medical screening examination/treatment/procedure(s) were performed by non-physician practitioner and as supervising physician I was immediately available for consultation/collaboration.  EKG Interpretation   None         Venetta Knee Y. Contrina Orona, MD 08/18/13 2104 

## 2013-08-26 ENCOUNTER — Encounter (HOSPITAL_COMMUNITY): Payer: Self-pay | Admitting: Emergency Medicine

## 2013-08-26 ENCOUNTER — Emergency Department (HOSPITAL_COMMUNITY)
Admission: EM | Admit: 2013-08-26 | Discharge: 2013-08-26 | Disposition: A | Discharge status: Home / Self Care | Payer: 59 | Attending: Emergency Medicine | Admitting: Emergency Medicine

## 2013-08-26 DIAGNOSIS — I10 Essential (primary) hypertension: Secondary | ICD-10-CM | POA: Insufficient documentation

## 2013-08-26 DIAGNOSIS — Z791 Long term (current) use of non-steroidal anti-inflammatories (NSAID): Secondary | ICD-10-CM | POA: Insufficient documentation

## 2013-08-26 DIAGNOSIS — Z09 Encounter for follow-up examination after completed treatment for conditions other than malignant neoplasm: Secondary | ICD-10-CM | POA: Insufficient documentation

## 2013-08-26 DIAGNOSIS — M25579 Pain in unspecified ankle and joints of unspecified foot: Secondary | ICD-10-CM | POA: Insufficient documentation

## 2013-08-26 NOTE — ED Notes (Signed)
Per pt, states he sprained ankle on the 26 th, needs note to go back to work

## 2013-08-26 NOTE — ED Provider Notes (Signed)
CSN: [id number]     Arrival date & time 08/26/13  [id number] History   First MD Initiated Contact with Patient 08/26/13 0802     Chief Complaint  Patient presents with  . Follow-up   (Consider location/radiation/quality/duration/timing/severity/associated sxs/prior Treatment) The history is provided by the patient and medical records. No language interpreter was used.    [name] is a 23 y.o. male  with a hx of retention presents to the Emergency Department stating that he was seen on 08/17/2013 for sprained ankle and needs a note to return to work. [place] [employer] [place]. He reports he's been out of work for 1.5 weeks and is ready to return. He states he continues to wear the brace on his ankle but feels as if he is strong enough to return to work.  He denies weakness, numbness, loss of balance.  Patient continues to take ibuprofen for pain control as needed and strenuous use of the ankle makes it sore but is tolerable.  Past Medical History  Diagnosis Date  . Hypertension    History reviewed. No pertinent past surgical history. Family History  Problem Relation Age of Onset  . Hypertension Brother   . Hypertension Other    History  Substance Use Topics  . Smoking status: Never Smoker   . Smokeless tobacco: Never Used  . Alcohol Use: No    Review of Systems  Constitutional: Negative for fever and chills.  Gastrointestinal: Negative for nausea and vomiting.  Musculoskeletal: Positive for arthralgias. Negative for back pain, joint swelling, neck pain and neck stiffness.  Skin: Negative for wound.  Neurological: Negative for numbness.  Hematological: Does not bruise/bleed easily.  Psychiatric/Behavioral: The patient is not nervous/anxious.   All other systems reviewed and are negative.    Allergies  Review of patient's allergies indicates no known allergies.  Home Medications   Current Outpatient Rx  Name  Route  Sig  Dispense  Refill   . naproxen (NAPROSYN) 500 MG tablet   Oral   Take 1 tablet (500 mg total) by mouth 2 (two) times daily with a meal.   30 tablet   0    BP 105/68  Pulse 64  Temp(Src) 98 F (36.7 C) (Oral)  Resp 12  SpO2 100% Physical Exam  Nursing note and vitals reviewed. Constitutional: He appears well-developed and well-nourished. No distress.  HENT:  Head: Normocephalic and atraumatic.  Eyes: Conjunctivae are normal.  Neck: Normal range of motion.  Cardiovascular: Normal rate, regular rhythm and intact distal pulses.   Capillary refill less than 3 seconds  Pulmonary/Chest: Effort normal and breath sounds normal.  Musculoskeletal: He exhibits no edema and no tenderness.  ROM: Full range of motion of the right ankle with mild pain No pain to palpation of the joint lines No swelling of the right ankle  Neurological: He is alert. Coordination normal.  Sensation intact to dull and sharp in the right lower extremity Strength 5/5 in the right lower extremity including dorsiflexion and plantar flexion  Skin: Skin is warm and dry. He is not diaphoretic. No erythema.  No tenting of the skin  Psychiatric: He has a normal mood and affect.    ED Course  Procedures (including critical care time) Labs Review Labs Reviewed - No data to display Imaging Review No results found.  EKG Interpretation   None       MDM   1. Follow-up exam      Eric A  Schneider presents as followup for several weeks after initial ankle sprain.  Patient with normal neurologic exam and good strength in the right lower extremity.  I believe he can safely return to work. Recommend that he continue to use his right ankle brace at work to stabilize his ankle and prevent further injury.  It has been determined that no acute conditions requiring further emergency intervention are present at this time. The patient/guardian have been advised of the diagnosis and plan. We have discussed signs and symptoms that warrant  return to the ED, such as changes or worsening in symptoms.   Vital signs are stable at discharge.   BP 105/68  Pulse 64  Temp(Src) 98 F (36.7 C) (Oral)  Resp 12  SpO2 100%  Patient/guardian has voiced understanding and agreed to follow-up with the PCP or specialist.        Dierdre Forth, PA-C 08/26/13 0820

## 2013-08-26 NOTE — Discharge Instructions (Signed)
1. Medications: usual home medications 2. Treatment: rest, drink plenty of fluids, wear brace as needed 3. Follow Up: Please followup with your primary doctor as needed

## 2013-08-27 NOTE — ED Provider Notes (Signed)
Medical screening examination/treatment/procedure(s) were performed by non-physician practitioner and as supervising physician I was immediately available for consultation/collaboration.  EKG Interpretation   None         Candyce ChurnJohn David Yolande Skoda III, MD 08/27/13 [phone]

## 2014-09-08 ENCOUNTER — Emergency Department (HOSPITAL_COMMUNITY)
Admission: EM | Admit: 2014-09-08 | Discharge: 2014-09-08 | Disposition: A | Discharge status: Home / Self Care | Payer: 59 | Attending: Emergency Medicine | Admitting: Emergency Medicine

## 2014-09-08 ENCOUNTER — Encounter (HOSPITAL_COMMUNITY): Payer: Self-pay | Admitting: Emergency Medicine

## 2014-09-08 DIAGNOSIS — I10 Essential (primary) hypertension: Secondary | ICD-10-CM | POA: Insufficient documentation

## 2014-09-08 DIAGNOSIS — IMO0002 Reserved for concepts with insufficient information to code with codable children: Secondary | ICD-10-CM

## 2014-09-08 DIAGNOSIS — Y9389 Activity, other specified: Secondary | ICD-10-CM | POA: Insufficient documentation

## 2014-09-08 DIAGNOSIS — Y998 Other external cause status: Secondary | ICD-10-CM | POA: Insufficient documentation

## 2014-09-08 DIAGNOSIS — Z23 Encounter for immunization: Secondary | ICD-10-CM | POA: Insufficient documentation

## 2014-09-08 DIAGNOSIS — Y92000 Kitchen of unspecified non-institutional (private) residence as  the place of occurrence of the external cause: Secondary | ICD-10-CM | POA: Insufficient documentation

## 2014-09-08 DIAGNOSIS — Y288XXA Contact with other sharp object, undetermined intent, initial encounter: Secondary | ICD-10-CM | POA: Insufficient documentation

## 2014-09-08 DIAGNOSIS — Z791 Long term (current) use of non-steroidal anti-inflammatories (NSAID): Secondary | ICD-10-CM | POA: Insufficient documentation

## 2014-09-08 DIAGNOSIS — S61411A Laceration without foreign body of right hand, initial encounter: Secondary | ICD-10-CM | POA: Insufficient documentation

## 2014-09-08 MED ORDER — TETANUS-DIPHTH-ACELL PERTUSSIS 5-2.5-18.5 LF-MCG/0.5 IM SUSP
0.5000 mL | Freq: Once | INTRAMUSCULAR | Status: AC
  Administered 2014-09-08: 0.5 mL via INTRAMUSCULAR
  Filled 2014-09-08: qty 0.5

## 2014-09-08 MED ORDER — CEPHALEXIN 500 MG PO CAPS: 500.0000 mg | ORAL_CAPSULE | Freq: Four times a day (QID) | ORAL | Status: DC

## 2014-09-08 NOTE — ED Provider Notes (Signed)
CSN: [id number]     Arrival date & time 09/08/14  1931 History  This chart was scribed for Wynetta EmeryNicole Chakara Bognar, PA-C, working with Glynn OctaveStephen Rancour, MD by Chestine SporeSoijett Blue, ED Scribe. The patient was seen in room TR07C/TR07C at 8:03 PM.    Chief Complaint  Patient presents with  . Laceration     The history is provided by the patient. No language interpreter was used.     HPI Comments: [name] is a 24 y.o. male who presents to the Emergency Department complaining of laceration onset 2 days ago. He reports that he obtained a laceration from a metal stove with some bleeding and drainage this morning. He notes that the laceration is splitting open further. He states that he has tried hydrogen peroxide, neosporin, and bandage with no relief for his symptoms. He denies any other symptoms. He reports that he is otherwise healthy.   Past Medical History  Diagnosis Date  . Hypertension    History reviewed. No pertinent past surgical history. Family History  Problem Relation Age of Onset  . Hypertension Brother   . Hypertension Other    History  Substance Use Topics  . Smoking status: Never Smoker   . Smokeless tobacco: Never Used  . Alcohol Use: No    Review of Systems  A complete 10 system review of systems was obtained and all systems are negative except as noted in the HPI and PMH.    Allergies  Review of patient's allergies indicates no known allergies.  Home Medications   Prior to Admission medications   Medication Sig Start Date End Date Taking? Authorizing Provider  naproxen (NAPROSYN) 500 MG tablet Take 1 tablet (500 mg total) by mouth 2 (two) times daily with a meal. 08/17/13   Kaitlyn Szekalski, PA-C   BP 136/81 mmHg  Pulse 59  Temp(Src) 98.2 F (36.8 C) (Oral)  Resp 20  SpO2 100% Physical Exam  Constitutional: He is oriented to person, place, and time. He appears well-developed and well-nourished. No distress.  HENT:  Head: Normocephalic and atraumatic.    Eyes: EOM are normal.  Neck: Neck supple. No tracheal deviation present.  Cardiovascular: Normal rate.   Pulmonary/Chest: Effort normal. No respiratory distress.  Musculoskeletal: Normal range of motion.       Right hand: He exhibits laceration.       Hands: 5 cm partial thickness laceration with no warmth discharge, or surrounding erythema.   Neurological: He is alert and oriented to person, place, and time.  Skin: Skin is warm and dry.  Psychiatric: He has a normal mood and affect. His behavior is normal.  Nursing note and vitals reviewed.   ED Course  Procedures (including critical care time) DIAGNOSTIC STUDIES: Oxygen Saturation is 100% on room air, normal by my interpretation.    COORDINATION OF CARE: 8:05 PM-Discussed treatment plan which includes f/u if the symptoms worsen, tetanus shot, neosporin with pt at bedside and pt agreed to plan.   Labs Review Labs Reviewed - No data to display  Imaging Review No results found.   EKG Interpretation None      MDM   Final diagnoses:  Laceration   Filed Vitals:   09/08/14 1951  BP: 136/81  Pulse: 59  Temp: 98.2 F (36.8 C)  TempSrc: Oral  Resp: 20  SpO2: 100%    Medications  Tdap (BOOSTRIX) injection 0.5 mL (0.5 mLs Intramuscular Given 09/08/14 2035)    [name] is a pleasant 24 y.o. male presenting  with partial-thickness laceration to right palm 3 days ago. No signs of infection. Wound will be left) secondary intention. Counseled patient on wound care.  Evaluation does not show pathology that would require ongoing emergent intervention or inpatient treatment. Pt is hemodynamically stable and mentating appropriately. Discussed findings and plan with patient/guardian, who agrees with care plan. All questions answered. Return precautions discussed and outpatient follow up given.   Discharge Medication List as of 09/08/2014  8:09 PM    START taking these medications   Details  cephALEXin (KEFLEX) 500 MG  capsule Take 1 capsule (500 mg total) by mouth 4 (four) times daily., Starting 09/08/2014, Until Discontinued, Print           I personally performed the services described in this documentation, which was scribed in my presence. The recorded information has been reviewed and is accurate.    Wynetta Emery, PA-C 09/09/14 9147  Glynn Octave, MD 09/09/14 [phone]

## 2014-09-08 NOTE — ED Notes (Signed)
Pt. presents with laceration at right palm sustained from a metal stove 3 days ago with slight bleeding and drainage this morning .

## 2014-09-08 NOTE — Discharge Instructions (Signed)
Wash the affected area with soap and water and apply a thin layer of topical antibiotic ointment. Do this every 12 hours.   Do not use rubbing alcohol or hydrogen peroxide.                        Look for signs of infection: if you see redness, if the area becomes warm, if pain increases sharply, there is discharge (pus), if red streaks appear or you develop fever or vomiting, RETURN immediately to the Emergency Department  for a recheck.   Do not hesitate to return to the emergency room for any new, worsening or concerning symptoms.  Please obtain primary care using resource guide below. But the minute you were seen in the emergency room and that they will need to obtain records for further outpatient management.   Laceration Care, Adult A laceration is a cut that goes through all layers of the skin. The cut goes into the tissue beneath the skin. HOME CARE For stitches (sutures) or staples:  Keep the cut clean and dry.  If you have a bandage (dressing), change it at least once a day. Change the bandage if it gets wet or dirty, or as told by your doctor.  Wash the cut with soap and water 2 times a day. Rinse the cut with water. Pat it dry with a clean towel.  Put a thin layer of medicated cream on the cut as told by your doctor.  You may shower after the first 24 hours. Do not soak the cut in water until the stitches are removed.  Only take medicines as told by your doctor.  Have your stitches or staples removed as told by your doctor. For skin adhesive strips:  Keep the cut clean and dry.  Do not get the strips wet. You may take a bath, but be careful to keep the cut dry.  If the cut gets wet, pat it dry with a clean towel.  The strips will fall off on their own. Do not remove the strips that are still stuck to the cut. For wound glue:  You may shower or take baths. Do not soak or scrub the cut. Do not swim. Avoid heavy sweating until the glue falls off on its own. After a  shower or bath, pat the cut dry with a clean towel.  Do not put medicine on your cut until the glue falls off.  If you have a bandage, do not put tape over the glue.  Avoid lots of sunlight or tanning lamps until the glue falls off. Put sunscreen on the cut for the first year to reduce your scar.  The glue will fall off on its own. Do not pick at the glue. You may need a tetanus shot if:  You cannot remember when you had your last tetanus shot.  You have never had a tetanus shot. If you need a tetanus shot and you choose not to have one, you may get tetanus. Sickness from tetanus can be serious. GET HELP RIGHT AWAY IF:   Your pain does not get better with medicine.  Your arm, hand, leg, or foot loses feeling (numbness) or changes color.  Your cut is bleeding.  Your joint feels weak, or you cannot use your joint.  You have painful lumps on your body.  Your cut is red, puffy (swollen), or painful.  You have a red line on the skin near the cut.  You have  yellowish-white fluid (pus) coming from the cut.  You have a fever.  You have a bad smell coming from the cut or bandage.  Your cut breaks open before or after stitches are removed.  You notice something coming out of the cut, such as wood or glass.  You cannot move a finger or toe. MAKE SURE YOU:   Understand these instructions.  Will watch your condition.  Will get help right away if you are not doing well or get worse. Document Released: 12/26/2007 Document Revised: 10/01/2011 Document Reviewed: 01/02/2011 [facility] Patient Information 2015 [place], [place]. This information is not intended to replace advice given to you by your health care provider. Make sure you discuss any questions you have with your health care provider.   Emergency Department Resource Guide 1) Find a Doctor and Pay Out of Pocket Although you won't have to find out who is covered by your insurance plan, it is a good idea to ask around and get  recommendations. You will then need to call the office and see if the doctor you have chosen will accept you as a new patient and what types of options they offer for patients who are self-pay. Some doctors offer discounts or will set up payment plans for their patients who do not have insurance, but you will need to ask so you aren't surprised when you get to your appointment.  2) Contact Your Local Health Department Not all health departments have doctors that can see patients for sick visits, but many do, so it is worth a call to see if yours does. If you don't know where your local health department is, you can check in your phone book. The CDC also has a tool to help you locate your state's health department, and many state websites also have listings of all of their local health departments.  3) Find a Walk-in Clinic If your illness is not likely to be very severe or complicated, you may want to try a walk in clinic. These are popping up all over the country in pharmacies, drugstores, and shopping centers. They're usually staffed by nurse practitioners or physician assistants that have been trained to treat common illnesses and complaints. They're usually fairly quick and inexpensive. However, if you have serious medical issues or chronic medical problems, these are probably not your best option.  No Primary Care Doctor: - Call Health Connect at  [phone] - they can help you locate a primary care doctor that  accepts your insurance, provides certain services, etc. - Physician Referral Service- [phone]  Chronic Pain Problems: Organization         Address  Phone   Notes  [facility]  [phone] Patients need to be referred by their primary care doctor.   Medication Assistance: Organization         Address  Phone   Notes  [facility] Medication [facility] [address] [place], [place] [address] [phone] --Must be a resident of  [facility] -- Must have NO insurance coverage whatsoever (no Medicaid/ Medicare, etc.) -- The pt. MUST have a primary care doctor that directs their care regularly and follows them in the community   [facility]  [phone]   [employer]  [phone]    Agencies that provide inexpensive medical care: Organization         Address  Phone   Notes  [facility]  [phone]   [facility]    [phone]   [facility] [address] [place], [place] [address] [phone]   [facility] [address] [place] [place] [phone]   Planned Parenthood    [phone]   [facility]    [phone]   [facility]  [address], [place] Phone:  [phone], Fax:  [phone] Hours of Operation:  9 am - 6 pm, M-F.  Also accepts Medicaid/Medicare and self-pay.  [facility]  [address], [place] Phone: [phone], Fax: [phone]. Hours of Operation:  8:30 am - 5:30 pm, M-F.  Also accepts Medicaid and self-pay.  [facility] [place] [address] [place] Phone: [phone]   [facility] [address] Natasha Bence [place], [place] [phone], Ext. [phone] Mondays & Thursdays: 7-9 AM.  First 15 patients are seen on a first come, first serve basis.    Medicaid-accepting [facility] Providers:  Organization         Address  Phone   Notes  [facility] [address],  [phone] Also accepts self-pay patients.  [facility] [address] Laurell Josephs [place], [place]  [phone]   [facility] [address], [place] [phone]   [facility] [address] [place] [phone]   Renaye Rakers [address], [place]   [phone] Only accepts [place] Access  [place] patients after they have their name applied to their card.   Self-Pay (no insurance) in [facility]:  Organization         Address  Phone   Notes  Sickle Cell Patients, [facility] Internal Medicine [address] [place], [place] [phone]   [facility] [address] [place], [place] [phone]   [facility] [place]  [address], [place] [phone]   [facility]/Dr. Osei-Bonsu  [address], [place] or [address], [place] [phone] Phone number for both [place] and [place] locations is the same.  Urgent Medical and [facility] [address], [place] [phone]   [facility] [address] [place] or [address] [phone]   [facility] [address], [place] [phone], phone; [phone], fax Sees patients 1st and 3rd Saturday of every month.  Must not qualify for public or private insurance (i.e. Medicaid, Medicare, [place] Health Choice, Veterans' Benefits)  Household income should be no more than 200% of the poverty level The clinic cannot treat you if you are pregnant or think you are pregnant  Sexually transmitted diseases are not treated at the clinic.    Dental Care: Organization         Address  Phone  Notes  [facility] Department of [facility] [address] [place], [place] [phone] Accepts children up to age 101 who are enrolled in [place] or [place] Health Choice; pregnant women with a Medicaid card; and children who have applied for Medicaid or [place] Health Choice, but were declined, whose parents can pay a reduced fee at time of service.  [facility] Department of [facility]  [address], [employer] [phone])  [phone] Accepts children up to age 28 who are enrolled in Medicaid or [place] Health Choice; pregnant women with a Medicaid  card; and children who have applied for Medicaid or [place] Health Choice, but were declined, whose parents can pay a reduced fee at time of service.  [facility]  [address] [place], [place] [phone] Patients are seen by appointment only. Walk-ins are not accepted. [facility] will see patients 18 years of age and older. Monday - Tuesday (8am-5pm) Most Wednesdays (8:30-5pm) $30 per visit, cash only  [facility] Adult Dental Access PRO[facility]  [address], [facility] [phone] Patients are seen by appointment only. Walk-ins are not accepted. [facility] will see patients 50 years of age and older. One Wednesday Evening (Monthly: Volunteer Based).  $30 per visit, cash only  [employer]  [phone] for adults; Children under age 35, call Graduate Pediatric Dentistry at [phone]. Children aged 77-14, please call [phone] to request a pediatric application.  Dental services are provided in all areas of dental care including fillings, crowns and bridges, complete and partial dentures, implants, gum treatment, root canals, and extractions. Preventive care is also provided. Treatment is provided to both adults and children. Patients are selected via a lottery and there is often a waiting list.   [facility] [address] [place]  [phone] [email or web address]   [facility] [address] [place], [place] [phone], Ext. [phone] Second and Fourth Thursday of each month, opens at 6:30 AM; Clinic ends at 9 AM.  Patients are seen on a first-come first-served basis, and a limited number are seen during each clinic.   [facility]  [address] Ether Griffins [place], [place] [phone]   Eligibility Requirements You must have lived in [place], [place], or [place] counties for at least the last three months.   You cannot be eligible for state or federal sponsored [place],  including [employer], [place], or [employer].   You generally cannot be eligible for healthcare insurance through your employer.    How to apply: Eligibility screenings are held every Tuesday and Wednesday afternoon from 1:00 pm until 4:00 pm. You do not need an appointment for the interview!  [facility] [address] [place], [place] [phone]   [facility] Health Department  [phone]   [facility] Health Department  [phone]   [facility]  [phone]    Behavioral Health Resources in the Community: Intensive Outpatient Programs Organization         Address  Phone  Notes  [facility] [address] [place], [place] [phone]   [facility] [address], [place], [place] [phone]   [facility] [address], [place], [place]  [phone]   [facility] Mental Health [address]  [place], [place] [phone] or [phone]   Substance Abuse Resources Organization         Address  Phone  Notes  [facility]  [phone]   [facility]  [phone]   [facility]  [phone]   Floydene Flock  [phone]   Residential & Outpatient Substance Abuse Program  [phone]   Psychological Services Organization         Address  Phone  Notes  [facility]  [phone]   [facility]  [phone]  [facility] [address] [place] [phone] or [phone]    Mobile Crisis Teams Organization         Address  Phone  Notes  [facility], Mobile Crisis Care Unit  [phone]   [facility]  [address] [place], [place] [phone]   Doristine Locks [address] [place] [phone]    Self-Help/Support Groups Organization         Address  Phone             Notes  [facility]oc[facility]  of [place] - variety of support groups  336- I7437963 Call for more information  Narcotics Anonymous (NA), [facility] [address], [employer] [place]  2 meetings at this location   Statistician         Address  Phone  Notes  [facility] [address],    [place]  [phone]   [facility]  [address], [place] [address], [place], [place] [phone]   [facility] [address] [place], [place] [phone] Admissions: 8am-3pm M-F  [facility] [address]    [place], [place] [phone]   [facility] [address] [place], [place], [place] [phone]   [facility] [address],  [place], [place] [phone]   [facility] - Intensive Outpatient [address] Laurell Josephs [address], [place], [place] [phone]   [facility] ([facility] [address] [place]  [place], [place] [phone] or [phone]   [facility] ([facility]) [address], [place], [place] [phone] Accepts Medicaid  [facility] [address]  [place] [phone] Substance Abuse/Addiction Treatment   [facility] Organization         Address  Phone  Notes  [facility]  [phone]   Angie Fava, PhD [address] Ervin Knack [place], [place]   [phone] or [phone]   [facility] Behavioral   [address] [place], [place] [phone]   [facility] [address] [place], [place] [phone] Insurance/Medicaid/sponsorship through [facility] and Families [address]                                    [place], [place] [phone] Therapy/tele-psych/case  [facility] [address], [place] [phone]    Dr. Lolly Mustache  [phone]   [facility]  [employer] [facility] Dept. 1) [address] [place] 2)  [address] [place] 3)  [address], [place] [phone]  [phone]   [facility] Child Abuse Hotline 623-172-0313 or (531)738-2380 (After Hours)

## 2014-10-11 ENCOUNTER — Emergency Department (HOSPITAL_COMMUNITY)
Admission: EM | Admit: 2014-10-11 | Discharge: 2014-10-11 | Disposition: A | Discharge status: Home / Self Care | Payer: 59 | Attending: Emergency Medicine | Admitting: Emergency Medicine

## 2014-10-11 ENCOUNTER — Encounter (HOSPITAL_COMMUNITY): Payer: Self-pay | Admitting: Emergency Medicine

## 2014-10-11 ENCOUNTER — Emergency Department (HOSPITAL_COMMUNITY): Payer: 59

## 2014-10-11 DIAGNOSIS — Z791 Long term (current) use of non-steroidal anti-inflammatories (NSAID): Secondary | ICD-10-CM | POA: Insufficient documentation

## 2014-10-11 DIAGNOSIS — I10 Essential (primary) hypertension: Secondary | ICD-10-CM | POA: Insufficient documentation

## 2014-10-11 DIAGNOSIS — J189 Pneumonia, unspecified organism: Secondary | ICD-10-CM

## 2014-10-11 DIAGNOSIS — J159 Unspecified bacterial pneumonia: Secondary | ICD-10-CM | POA: Insufficient documentation

## 2014-10-11 DIAGNOSIS — Z792 Long term (current) use of antibiotics: Secondary | ICD-10-CM | POA: Insufficient documentation

## 2014-10-11 MED ORDER — AZITHROMYCIN 250 MG PO TABS
500.0000 mg | ORAL_TABLET | Freq: Once | ORAL | Status: AC
  Administered 2014-10-11: 500 mg via ORAL
  Filled 2014-10-11: qty 2

## 2014-10-11 MED ORDER — AZITHROMYCIN 250 MG PO TABS: 250.0000 mg | ORAL_TABLET | Freq: Every day | ORAL | Status: DC

## 2014-10-11 NOTE — ED Provider Notes (Signed)
CSN: [id number]     Arrival date & time 10/11/14  0226 History  This chart was scribed for Eric CrumbleAdeleke Anjelika Ausburn, MD by Tanda RockersMargaux Venter, ED Scribe. This patient was seen in room A10C/A10C and the patient's care was started at 2:46 AM.    Chief Complaint  Patient presents with  . Shortness of Breath  . Chest Pain  . Cough   The history is provided by the patient. No language interpreter was used.     HPI Comments: [name] is a 24 y.o. male who presents to the Emergency Department complaining of left sided chest pain that began 2 days ago. Pt describes it as a tight pressure. Pt admits to productive cough with dark green phlegm prior to the chest pain. He complains of shortness of breath as well. Pt states that the pain is exacerbated at night, with rest, and while coughing. He admits to one bout of diaphoresis. Pt had recent sick contact with family members who had similar symptoms. He reports that his daughter had a cough prior to him getting sick. His daughter was in the ED 1 week ago for croup. Denies leg swelling, recent prolonged travel, recent surgery, or history of DVT or PE.   Past Medical History  Diagnosis Date  . Hypertension    History reviewed. No pertinent past surgical history. Family History  Problem Relation Age of Onset  . Hypertension Brother   . Hypertension Other    History  Substance Use Topics  . Smoking status: Never Smoker   . Smokeless tobacco: Never Used  . Alcohol Use: No    Review of Systems  10 Systems reviewed and all are negative for acute change except as noted in the HPI.    Allergies  Review of patient's allergies indicates no known allergies.  Home Medications   Prior to Admission medications   Medication Sig Start Date End Date Taking? Authorizing Provider  cephALEXin (KEFLEX) 500 MG capsule Take 1 capsule (500 mg total) by mouth 4 (four) times daily. 09/08/14   Nicole Pisciotta, PA-C  naproxen (NAPROSYN) 500 MG tablet Take 1 tablet (500  mg total) by mouth 2 (two) times daily with a meal. 08/17/13   Emilia BeckKaitlyn Szekalski, PA-C   Triage Vitals: BP 131/74 mmHg  Pulse 59  Temp(Src) 97.7 F (36.5 C) (Oral)  Resp 18  Ht 5\' 11"  (1.803 m)  Wt 171 lb (77.565 kg)  BMI 23.86 kg/m2  SpO2 100%   Physical Exam  Constitutional: He is oriented to person, place, and time. Vital signs are normal. He appears well-developed and well-nourished.  Non-toxic appearance. He does not appear ill. No distress.  HENT:  Head: Normocephalic and atraumatic.  Nose: Nose normal.  Mouth/Throat: Oropharynx is clear and moist. No oropharyngeal exudate.  Eyes: Conjunctivae and EOM are normal. Pupils are equal, round, and reactive to light. No scleral icterus.  Neck: Normal range of motion. Neck supple. No tracheal deviation, no edema, no erythema and normal range of motion present. No thyroid mass and no thyromegaly present.  Cardiovascular: Normal rate, regular rhythm, S1 normal, S2 normal, normal heart sounds, intact distal pulses and normal pulses.  Exam reveals no gallop and no friction rub.   No murmur heard. Pulses:      Radial pulses are 2+ on the right side, and 2+ on the left side.       Dorsalis pedis pulses are 2+ on the right side, and 2+ on the left side.  Pulmonary/Chest: Effort normal and  breath sounds normal. No respiratory distress. He has no wheezes. He has no rhonchi. He has no rales.  Abdominal: Soft. Normal appearance and bowel sounds are normal. He exhibits no distension, no ascites and no mass. There is no hepatosplenomegaly. There is no tenderness. There is no rebound, no guarding and no CVA tenderness.  Musculoskeletal: Normal range of motion. He exhibits no edema or tenderness.  Lymphadenopathy:    He has no cervical adenopathy.  Neurological: He is alert and oriented to person, place, and time. He has normal strength. No cranial nerve deficit or sensory deficit.  Skin: Skin is warm, dry and intact. No petechiae and no rash noted. He  is not diaphoretic. No erythema. No pallor.  Psychiatric: He has a normal mood and affect. His behavior is normal. Judgment normal.  Nursing note and vitals reviewed.   ED Course  Procedures (including critical care time)  DIAGNOSTIC STUDIES: Oxygen Saturation is 100% on RA, Normal by my interpretation.    COORDINATION OF CARE: 2:50 AM-Discussed treatment plan which includes CXR with pt at bedside and pt agreed to plan.   Labs Review Labs Reviewed - No data to display  Imaging Review Dg Chest 2 View  10/11/2014   CLINICAL DATA:  Acute onset of shortness of breath, left-sided chest pain and cough. Initial encounter.  EXAM: CHEST  2 VIEW  COMPARISON:  Chest radiograph performed 04/24/2011  FINDINGS: The lungs are well-aerated. Mild peribronchial thickening is noted. There is no evidence of focal opacification, pleural effusion or pneumothorax.  The heart is normal in size; the mediastinal contour is within normal limits. No acute osseous abnormalities are seen.  IMPRESSION: Mild peribronchial thickening noted; lungs otherwise clear. No displaced rib fracture seen.   Electronically Signed   By: Roanna Raider M.D.   On: 10/11/2014 03:00     EKG Interpretation   Date/Time:  Monday October 11 2014 02:30:32 EDT Ventricular Rate:  61 PR Interval:  120 QRS Duration: 80 QT Interval:  364 QTC Calculation: 366 R Axis:   73 Text Interpretation:  Normal sinus rhythm Normal ECG Confirmed by Erroll Luna [phone]) on 10/11/2014 2:55:50 AM      MDM   Final diagnoses:  None   patient does emergency department for shortness of breath, coughing and left-sided chest pain. I believe the patient is low risk for ACS as he states the chest pain is worse with coughing, there is no exertional component, there is no emesis or diaphoresis associated. Patient has no other medical problems. Chest x-ray shows mild peribronchial thickening on the left size. This correlates with his area of pain. Due to  patient's history of fevers and chills I will treat for pneumonia with five-day course of azithromycin. 500 mg was given in the emergency department. Patient was advised to follow-up with his primary care physician within 3 days for continued management. His vital signs were within his normal limits and he is safe for discharge.  I personally performed the services described in this documentation, which was scribed in my presence. The recorded information has been reviewed and is accurate.      Eric Crumble, MD 10/11/14 [phone]

## 2014-10-11 NOTE — ED Notes (Signed)
C/o sob that is worse when lying down, intermittent L sided chest pain, and productive cough with dark green phlegm x 2-3 days.

## 2014-10-11 NOTE — Discharge Instructions (Signed)
Pneumonia, Adult Mr. [name], your x-ray shows what may be an early pneumonia.  Take antibiotics as prescribed and follow-up with a primary care physician within 3 days for continued management. If any symptoms worsen come back to the emergency department immediately. Thank you. Pneumonia is an infection of the lungs. It may be caused by a germ (virus or bacteria). Some types of pneumonia can spread easily from person to person. This can happen when you cough or sneeze. HOME CARE  Only take medicine as told by your doctor.  Take your medicine (antibiotics) as told. Finish it even if you start to feel better.  Do not smoke.  You may use a vaporizer or humidifier in your room. This can help loosen thick spit (mucus).  Sleep so you are almost sitting up (semi-upright). This helps reduce coughing.  Rest. A shot (vaccine) can help prevent pneumonia. Shots are often advised for:  People over 25 years old.  Patients on chemotherapy.  People with long-term (chronic) lung problems.  People with immune system problems. GET HELP RIGHT AWAY IF:   You are getting worse.  You cannot control your cough, and you are losing sleep.  You cough up blood.  Your pain gets worse, even with medicine.  You have a fever.  Any of your problems are getting worse, not better.  You have shortness of breath or chest pain. MAKE SURE YOU:   Understand these instructions.  Will watch your condition.  Will get help right away if you are not doing well or get worse. Document Released: 12/26/2007 Document Revised: 10/01/2011 Document Reviewed: 09/29/2010 [facility] Patient Information 2015 [place], [place]. This information is not intended to replace advice given to you by your health care provider. Make sure you discuss any questions you have with your health care provider.

## 2014-10-18 ENCOUNTER — Encounter (HOSPITAL_COMMUNITY): Payer: Self-pay | Admitting: Emergency Medicine

## 2014-10-18 ENCOUNTER — Emergency Department (HOSPITAL_COMMUNITY)
Admission: EM | Admit: 2014-10-18 | Discharge: 2014-10-18 | Disposition: A | Discharge status: Home / Self Care | Payer: 59 | Attending: Emergency Medicine | Admitting: Emergency Medicine

## 2014-10-18 DIAGNOSIS — Z792 Long term (current) use of antibiotics: Secondary | ICD-10-CM | POA: Insufficient documentation

## 2014-10-18 DIAGNOSIS — R0602 Shortness of breath: Secondary | ICD-10-CM | POA: Insufficient documentation

## 2014-10-18 DIAGNOSIS — J40 Bronchitis, not specified as acute or chronic: Secondary | ICD-10-CM | POA: Insufficient documentation

## 2014-10-18 DIAGNOSIS — R079 Chest pain, unspecified: Secondary | ICD-10-CM | POA: Insufficient documentation

## 2014-10-18 DIAGNOSIS — I10 Essential (primary) hypertension: Secondary | ICD-10-CM | POA: Insufficient documentation

## 2014-10-18 DIAGNOSIS — Z791 Long term (current) use of non-steroidal anti-inflammatories (NSAID): Secondary | ICD-10-CM | POA: Insufficient documentation

## 2014-10-18 MED ORDER — NAPROXEN 250 MG PO TABS
500.0000 mg | ORAL_TABLET | Freq: Once | ORAL | Status: AC
  Administered 2014-10-18: 500 mg via ORAL
  Filled 2014-10-18: qty 2

## 2014-10-18 MED ORDER — ALBUTEROL SULFATE HFA 108 (90 BASE) MCG/ACT IN AERS
2.0000 | INHALATION_SPRAY | Freq: Once | RESPIRATORY_TRACT | Status: AC
  Administered 2014-10-18: 2 via RESPIRATORY_TRACT
  Filled 2014-10-18: qty 6.7

## 2014-10-18 NOTE — ED Notes (Signed)
Brought pt back to room; pt undressed, in gown, on monitor; EKG performed; Nehemiah SettleBrooke, RN present in room

## 2014-10-18 NOTE — ED Notes (Signed)
Pt complains of chest pain and cough X 2 weeks.  His pain comes and goes.  Pain comes more often when he lays down at night.

## 2014-10-18 NOTE — ED Provider Notes (Signed)
CSN: [id number]     Arrival date & time 10/18/14  0757 History   First MD Initiated Contact with Patient 10/18/14 [phone]     Chief Complaint  Patient presents with  . Chest Pain  . Cough     (Consider location/radiation/quality/duration/timing/severity/associated sxs/prior Treatment) Patient is a 24 y.o. male presenting with URI. The history is provided by the patient.  URI Presenting symptoms: congestion and cough   Presenting symptoms: no fever and no sore throat   Severity:  Moderate Onset quality:  Gradual Duration:  2 weeks Timing:  Constant Progression:  Unchanged Chronicity:  New Relieved by:  Nothing Worsened by:  Nothing tried Ineffective treatments: azithromycin. Associated symptoms: no arthralgias, no myalgias, no sinus pain, no swollen glands and no wheezing     Past Medical History  Diagnosis Date  . Hypertension    History reviewed. No pertinent past surgical history. Family History  Problem Relation Age of Onset  . Hypertension Brother   . Hypertension Other    History  Substance Use Topics  . Smoking status: Never Smoker   . Smokeless tobacco: Never Used  . Alcohol Use: No    Review of Systems  Constitutional: Negative for fever.  HENT: Positive for congestion. Negative for sore throat.   Respiratory: Positive for cough. Negative for wheezing.   Musculoskeletal: Negative for myalgias and arthralgias.  All other systems reviewed and are negative.     Allergies  Review of patient's allergies indicates no known allergies.  Home Medications   Prior to Admission medications   Medication Sig Start Date End Date Taking? Authorizing Provider  azithromycin (ZITHROMAX) 250 MG tablet Take 1 tablet (250 mg total) by mouth daily. 10/11/14   Tomasita CrumbleAdeleke Oni, MD  cephALEXin (KEFLEX) 500 MG capsule Take 1 capsule (500 mg total) by mouth 4 (four) times daily. Patient not taking: Reported on 10/18/2014 09/08/14   Joni ReiningNicole Pisciotta, PA-C  naproxen (NAPROSYN) 500 MG  tablet Take 1 tablet (500 mg total) by mouth 2 (two) times daily with a meal. Patient not taking: Reported on 10/18/2014 08/17/13   Emilia BeckKaitlyn Szekalski, PA-C   BP 109/63 mmHg  Pulse 54  Temp(Src) 98.1 F (36.7 C) (Oral)  Resp 19  Ht 5\' 10"  (1.778 m)  Wt 174 lb (78.926 kg)  BMI 24.97 kg/m2  SpO2 95% Physical Exam  Constitutional: He is oriented to person, place, and time. He appears well-developed and well-nourished. No distress.  Alert, pleasant, well hydrated  HENT:  Head: Normocephalic and atraumatic.  Mouth/Throat: Oropharynx is clear and moist. No oropharyngeal exudate.  Eyes: EOM are normal. Pupils are equal, round, and reactive to light.  Neck: Normal range of motion. Neck supple.  Cardiovascular: Normal rate, regular rhythm, normal heart sounds and intact distal pulses.  Exam reveals no gallop and no friction rub.   No murmur heard. Pulmonary/Chest: Effort normal and breath sounds normal. No respiratory distress. He has no wheezes. He has no rales.  Abdominal: Soft. He exhibits no distension. There is no tenderness.  Musculoskeletal: Normal range of motion. He exhibits no edema.  Lymphadenopathy:    He has no cervical adenopathy.  Neurological: He is alert and oriented to person, place, and time. No cranial nerve deficit.  Skin: Skin is warm and dry. No rash noted. He is not diaphoretic.  Psychiatric: He has a normal mood and affect. His behavior is normal. Judgment and thought content normal.  Nursing note and vitals reviewed.   ED Course  Procedures (including critical care time)  Labs Review Labs Reviewed - No data to display  Imaging Review No results found.   EKG Interpretation   Date/Time:  Monday October 18 2014 08:03:46 EDT Ventricular Rate:  71 PR Interval:  135 QRS Duration: 87 QT Interval:  377 QTC Calculation: 410 R Axis:   77 Text Interpretation:  Sinus rhythm Confirmed by ZAVITZ  MD, JOSHUA ([id number])  on 10/18/2014 8:52:05 AM      MDM   Final  diagnoses:  Bronchitis   24 year old male presents with left-sided chest pain as well as shortness of breath, worse at night. Describes a tightness and muscle cramping. He was seen here for this on the 21st and chest x-ray showed peribronchial thickening. He is PERC negative and no suspicion for PE. Additionally, no risk factors for ACS and symptoms with continued to nonproductive cough and chest tightness are consistent with bronchitis. He has completed a course of antibiotics but has not been doing anti-inflammatories and has no beta agonist inhalers at home. No indication for repeat imaging at this time given reassuring symptoms with bilateral breath sounds, stable vital signs, and chronicity of illness. We will do a trial of albuterol and anti-inflammatories and follow up with PCP.  Dorna Leitz, MD 10/18/14 1456  Blane Ohara, MD 10/18/14 [phone]

## 2014-10-18 NOTE — Discharge Instructions (Signed)
If you were given medicines take as directed.  If you are on coumadin or contraceptives realize their levels and effectiveness is altered by many different medicines.  If you have any reaction (rash, tongues swelling, other) to the medicines stop taking and see a physician.   Please follow up as directed and return to the ER or see a physician for new or worsening symptoms.  Thank you. Filed Vitals:   10/18/14 0808 10/18/14 0809 10/18/14 0815  BP: 131/72  110/54  Pulse: 73  65  Temp: 98.1 F (36.7 C)    TempSrc: Oral    Resp: 15  16  Height: 5\' 10"  (1.778 m)    Weight: 174 lb (78.926 kg)    SpO2: 100% 100% 97%

## 2015-01-21 ENCOUNTER — Encounter (HOSPITAL_COMMUNITY): Payer: Self-pay | Admitting: Emergency Medicine

## 2015-01-21 ENCOUNTER — Emergency Department (HOSPITAL_COMMUNITY)
Admission: EM | Admit: 2015-01-21 | Discharge: 2015-01-21 | Disposition: A | Discharge status: Home / Self Care | Payer: Self-pay | Source: Home / Self Care

## 2015-01-21 NOTE — ED Notes (Signed)
Pt could not wait any longer... States he has to be at work at his new job Notified Velia Meyerathy Rossi, NP

## 2015-01-21 NOTE — ED Notes (Signed)
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 Last tetanus = unknown Alert, no signs of acute distress.

## 2015-06-21 ENCOUNTER — Encounter (HOSPITAL_COMMUNITY): Payer: Self-pay | Admitting: Emergency Medicine

## 2015-06-21 ENCOUNTER — Emergency Department (HOSPITAL_COMMUNITY)
Admission: EM | Admit: 2015-06-21 | Discharge: 2015-06-21 | Disposition: A | Discharge status: Home / Self Care | Payer: Self-pay | Attending: Emergency Medicine | Admitting: Emergency Medicine

## 2015-06-21 DIAGNOSIS — Y9389 Activity, other specified: Secondary | ICD-10-CM | POA: Insufficient documentation

## 2015-06-21 DIAGNOSIS — Y990 Civilian activity done for income or pay: Secondary | ICD-10-CM | POA: Insufficient documentation

## 2015-06-21 DIAGNOSIS — Y288XXA Contact with other sharp object, undetermined intent, initial encounter: Secondary | ICD-10-CM | POA: Insufficient documentation

## 2015-06-21 DIAGNOSIS — I10 Essential (primary) hypertension: Secondary | ICD-10-CM | POA: Insufficient documentation

## 2015-06-21 DIAGNOSIS — Z23 Encounter for immunization: Secondary | ICD-10-CM | POA: Insufficient documentation

## 2015-06-21 DIAGNOSIS — Y9289 Other specified places as the place of occurrence of the external cause: Secondary | ICD-10-CM | POA: Insufficient documentation

## 2015-06-21 DIAGNOSIS — S81811A Laceration without foreign body, right lower leg, initial encounter: Secondary | ICD-10-CM | POA: Insufficient documentation

## 2015-06-21 MED ORDER — IBUPROFEN 800 MG PO TABS: 600.0000 mg | ORAL_TABLET | Freq: Three times a day (TID) | ORAL | Status: DC

## 2015-06-21 MED ORDER — TETANUS-DIPHTH-ACELL PERTUSSIS 5-2.5-18.5 LF-MCG/0.5 IM SUSP
0.5000 mL | Freq: Once | INTRAMUSCULAR | Status: AC
  Administered 2015-06-21: 0.5 mL via INTRAMUSCULAR
  Filled 2015-06-21: qty 0.5

## 2015-06-21 MED ORDER — LIDOCAINE-EPINEPHRINE (PF) 2 %-1:200000 IJ SOLN
10.0000 mL | Freq: Once | INTRAMUSCULAR | Status: AC
  Administered 2015-06-21: 10 mL via INTRADERMAL
  Filled 2015-06-21: qty 20

## 2015-06-21 MED ORDER — BACITRACIN ZINC 500 UNIT/GM EX OINT
TOPICAL_OINTMENT | Freq: Two times a day (BID) | CUTANEOUS | Status: DC
  Administered 2015-06-21: 1 via TOPICAL
  Filled 2015-06-21: qty 0.9

## 2015-06-21 NOTE — ED Notes (Signed)
Pt. Stated, I cut my leg on a piece of steal at work.  11/2 inch to rt. Lower leg.

## 2015-06-21 NOTE — ED Provider Notes (Signed)
CSN: [id number]     Arrival date & time 06/21/15  1612 History  By signing my name below, I, [name], attest that this documentation has been prepared under the direction and in the presence of Federated Department StoresHanna Patel-Mills, PA-C. Electronically Signed: Angelene GiovanniEmmanuella Schneider, ED Scribe. 06/21/2015. 5:50 PM.    Chief Complaint  Patient presents with  . Leg Pain   The history is provided by the patient. No language interpreter was used.   HPI Comments: Eric FrancoMicheal A Eliot Schneider is a 24 y.o. male with a hx of HTN who presents to the Emergency Department complaining of a moderate right leg laceration s/p leg injury that occurred at work 3 hours PTA. Pt's leg is not currently actively bleeding. He denies any fever, or n/v. He explains that he slipped and cut his leg on steel while he was working. Pt is unsure of his last tetanus vaccine. No alleviating factors noted. No treatment prior to arrival. He denies any head injury or loss of consciousness. He denies any numbness or tingling. Past Medical History  Diagnosis Date  . Hypertension    History reviewed. No pertinent past surgical history. Family History  Problem Relation Age of Onset  . Hypertension Brother   . Hypertension Other    Social History  Substance Use Topics  . Smoking status: Never Smoker   . Smokeless tobacco: Never Used  . Alcohol Use: No    Review of Systems  Gastrointestinal: Negative for nausea and vomiting.  Skin: Positive for wound.  Neurological: Negative for numbness.      Allergies  Review of patient's allergies indicates no known allergies.  Home Medications   Prior to Admission medications   Medication Sig Start Date End Date Taking? Authorizing Provider  azithromycin (ZITHROMAX) 250 MG tablet Take 1 tablet (250 mg total) by mouth daily. 10/11/14   Tomasita CrumbleAdeleke Oni, MD  cephALEXin (KEFLEX) 500 MG capsule Take 1 capsule (500 mg total) by mouth 4 (four) times daily. Patient not taking: Reported on 10/18/2014 09/08/14    Joni ReiningNicole Pisciotta, PA-C  ibuprofen (ADVIL,MOTRIN) 800 MG tablet Take 1 tablet (800 mg total) by mouth 3 (three) times daily. 06/21/15   Watt Geiler Patel-Mills, PA-C  naproxen (NAPROSYN) 500 MG tablet Take 1 tablet (500 mg total) by mouth 2 (two) times daily with a meal. Patient not taking: Reported on 10/18/2014 08/17/13   Kaitlyn Szekalski, PA-C   BP 121/97 mmHg  Pulse 56  Temp(Src) 98 F (36.7 C) (Oral)  Resp 18  Ht 5\' 10"  (1.778 m)  SpO2 100% Physical Exam  Constitutional: He is oriented to person, place, and time. He appears well-developed and well-nourished. No distress.  HENT:  Head: Normocephalic and atraumatic.  Eyes: Conjunctivae and EOM are normal.  Neck: Neck supple. No tracheal deviation present.  Cardiovascular: Normal rate.   Pulmonary/Chest: Effort normal. No respiratory distress.  Musculoskeletal: Normal range of motion.  Able to plantar and dorsiflex without difficulty 2+ DP pulse  3 cm linear laceration with no active bleeding over the middle tibia through the dermis but no bone exposed. No foreign body seen or palpated. Pt is ambulatory with a steady gait.   Neurological: He is alert and oriented to person, place, and time.  Skin: Skin is warm and dry.  Psychiatric: He has a normal mood and affect. His behavior is normal.  Nursing note and vitals reviewed.   ED Course  Procedures (including critical care time) DIAGNOSTIC STUDIES: Oxygen Saturation is 100% on RA, normal by my interpretation.  COORDINATION OF CARE: 4:50 PM- Pt advised of plan for treatment and pt agrees. Pt will receive a tetanus vaccine and a laceration repair. Will prescribe pain medication. Offered a work note and pt declined.   5:47 PM - LACERATION REPAIR PROCEDURE NOTE The patient's identification was confirmed and consent was obtained. This procedure was performed by Catha Gosselin, PA-C at 5:47 PM. Site: Middle tibia  Sterile procedures observed Anesthetic used (type and amt): 4 cc of  lidocaine 2% with epi Suture type/size:4.0 Prolene Length:3 cm # of Sutures: 4 Technique:simple interrupted  Complexity: simple Antibx ointment applied: Bacitracin  Tetanus ordered.  Site anesthetized, irrigated with NS, explored without evidence of foreign body, wound well approximated, site covered with dry, sterile dressing. Patient tolerated procedure well without complications. Instructions for care discussed verbally and patient provided with additional written instructions for homecare and f/u.   Dressing was applied.  MDM   Final diagnoses:  Laceration of right lower leg, initial encounter  Patient presents for laceration to right leg after slip and fall. No foreign body seen or palpated. I do not believe the patient needs imaging. I explained that he should have the sutures removed in 7-10 days by his primary care doctor or he can return to the ED. Patient verbally agrees with the plan. Return precautions discussed. Medications  bacitracin ointment (1 application Topical Given 06/21/15 1800)  lidocaine-EPINEPHrine (XYLOCAINE W/EPI) 2 %-1:200000 (PF) injection 10 mL (10 mLs Intradermal Given 06/21/15 1721)  Tdap (BOOSTRIX) injection 0.5 mL (0.5 mLs Intramuscular Given 06/21/15 1722)   I personally performed the services described in this documentation, which was scribed in my presence. The recorded information has been reviewed and is accurate.       Catha Gosselin, PA-C 06/21/15 1854  Zadie Rhine, MD 06/22/15 1245

## 2015-06-21 NOTE — Discharge Instructions (Signed)
Laceration Care, Adult Have your sutures removed in 7-10 days. A laceration is a cut that goes through all layers of the skin. The cut also goes into the tissue that is right under the skin. Some cuts heal on their own. Others need to be closed with stitches (sutures), staples, skin adhesive strips, or wound glue. Taking care of your cut lowers your risk of infection and helps your cut to heal better. HOW TO TAKE CARE OF YOUR CUT For stitches or staples:  Keep the wound clean and dry.  If you were given a bandage (dressing), you should change it at least one time per day or as told by your doctor. You should also change it if it gets wet or dirty.  Keep the wound completely dry for the first 24 hours or as told by your doctor. After that time, you may take a shower or a bath. However, make sure that the wound is not soaked in water until after the stitches or staples have been removed.  Clean the wound one time each day or as told by your doctor:  Wash the wound with soap and water.  Rinse the wound with water until all of the soap comes off.  Pat the wound dry with a clean towel. Do not rub the wound.  After you clean the wound, put a thin layer of antibiotic ointment on it as told by your doctor. This ointment:  Helps to prevent infection.  Keeps the bandage from sticking to the wound.  Have your stitches or staples removed as told by your doctor. If your doctor used skin adhesive strips:   Keep the wound clean and dry.  If you were given a bandage, you should change it at least one time per day or as told by your doctor. You should also change it if it gets dirty or wet.  Do not get the skin adhesive strips wet. You can take a shower or a bath, but be careful to keep the wound dry.  If the wound gets wet, pat it dry with a clean towel. Do not rub the wound.  Skin adhesive strips fall off on their own. You can trim the strips as the wound heals. Do not remove any strips that  are still stuck to the wound. They will fall off after a while. If your doctor used wound glue:  Try to keep your wound dry, but you may briefly wet it in the shower or bath. Do not soak the wound in water, such as by swimming.  After you take a shower or a bath, gently pat the wound dry with a clean towel. Do not rub the wound.  Do not do any activities that will make you really sweaty until the skin glue has fallen off on its own.  Do not apply liquid, cream, or ointment medicine to your wound while the skin glue is still on.  If you were given a bandage, you should change it at least one time per day or as told by your doctor. You should also change it if it gets dirty or wet.  If a bandage is placed over the wound, do not let the tape for the bandage touch the skin glue.  Do not pick at the glue. The skin glue usually stays on for 5-10 days. Then, it falls off of the skin. General Instructions  To help prevent scarring, make sure to cover your wound with sunscreen whenever you are outside after stitches  are removed, after adhesive strips are removed, or when wound glue stays in place and the wound is healed. Make sure to wear a sunscreen of at least 30 SPF.  Take over-the-counter and prescription medicines only as told by your doctor.  If you were given antibiotic medicine or ointment, take or apply it as told by your doctor. Do not stop using the antibiotic even if your wound is getting better.  Do not scratch or pick at the wound.  Keep all follow-up visits as told by your doctor. This is important.  Check your wound every day for signs of infection. Watch for:  Redness, swelling, or pain.  Fluid, blood, or pus.  Raise (elevate) the injured area above the level of your heart while you are sitting or lying down, if possible. GET HELP IF:  You got a tetanus shot and you have any of these problems at the injection site:  Swelling.  Very bad  pain.  Redness.  Bleeding.  You have a fever.  A wound that was closed breaks open.  You notice a bad smell coming from your wound or your bandage.  You notice something coming out of the wound, such as wood or glass.  Medicine does not help your pain.  You have more redness, swelling, or pain at the site of your wound.  You have fluid, blood, or pus coming from your wound.  You notice a change in the color of your skin near your wound.  You need to change the bandage often because fluid, blood, or pus is coming from the wound.  You start to have a new rash.  You start to have numbness around the wound. GET HELP RIGHT AWAY IF:  You have very bad swelling around the wound.  Your pain suddenly gets worse and is very bad.  You notice painful lumps near the wound or on skin that is anywhere on your body.  You have a red streak going away from your wound.  The wound is on your hand or foot and you cannot move a finger or toe like you usually can.  The wound is on your hand or foot and you notice that your fingers or toes look pale or bluish.   This information is not intended to replace advice given to you by your health care provider. Make sure you discuss any questions you have with your health care provider.   Document Released: 12/26/2007 Document Revised: 11/23/2014 Document Reviewed: 07/05/2014 Elsevier Interactive Patient Education 2016 [employer].  Emergency Department Resource Guide 1) Find a Doctor and Pay Out of Pocket Although you won't have to find out who is covered by your insurance plan, it is a good idea to ask around and get recommendations. You will then need to call the office and see if the doctor you have chosen will accept you as a new patient and what types of options they offer for patients who are self-pay. Some doctors offer discounts or will set up payment plans for their patients who do not have insurance, but you will need to ask so you  aren't surprised when you get to your appointment.  2) Contact Your Local Health Department Not all health departments have doctors that can see patients for sick visits, but many do, so it is worth a call to see if yours does. If you don't know where your local health department is, you can check in your phone book. The CDC also has a tool to  help you locate your state's health department, and many state websites also have listings of all of their local health departments.  3) Find a Walk-in Clinic If your illness is not likely to be very severe or complicated, you may want to try a walk in clinic. These are popping up all over the country in pharmacies, drugstores, and shopping centers. They're usually staffed by nurse practitioners or physician assistants that have been trained to treat common illnesses and complaints. They're usually fairly quick and inexpensive. However, if you have serious medical issues or chronic medical problems, these are probably not your best option.  No Primary Care Doctor: - Call Health Connect at  [phone] - they can help you locate a primary care doctor that  accepts your insurance, provides certain services, etc. - Physician Referral Service- [phone]  Chronic Pain Problems: Organization         Address  Phone   Notes  [facility]  [phone] Patients need to be referred by their primary care doctor.   Medication Assistance: Organization         Address  Phone   Notes  [facility] Medication [facility] [address] [place], [place] [address] [phone] --Must be a resident of [facility] -- Must have NO insurance coverage whatsoever (no Medicaid/ Medicare, etc.) -- The pt. MUST have a primary care doctor that directs their care regularly and follows them in the community   [facility]  [phone]   [employer]  [phone]    Agencies that provide inexpensive medical care: Organization          Address  Phone   Notes  [facility]  [phone]   [facility]    [phone]   [facility] [address] [place], [place] [address] [phone]   [facility] [address] [place] [phone]   Planned Parenthood    [phone]   [facility]    [phone]   [facility]  [address], [place] Phone:  [phone], Fax:  [phone] Hours of Operation:  9 am - 6 pm, M-F.  Also accepts Medicaid/Medicare and self-pay.  [facility]  [address], [place] Phone: [phone], Fax: [phone]. Hours of Operation:  8:30 am - 5:30 pm, M-F.  Also accepts Medicaid and self-pay.  [facility] [place] [address] [place] Phone: [phone]   [facility] [address] Natasha Bence [place], [place] [phone], Ext. [phone] Mondays & Thursdays: 7-9 AM.  First 15 patients are seen on a first come, first serve basis.    Medicaid-accepting [facility] Providers:  Organization         Address  Phone   Notes  [facility] [address], [place] [phone] Also accepts self-pay patients.  [facility] [address] Laurell Josephs [place], [place]  [phone]   [facility] [address], [place] [phone]   [facility] [address] [place] [phone]   Renaye Rakers [address], [place]   [phone] Only accepts [place] Access [place] patients after they have their name applied to their card.   Self-Pay (no insurance) in [facility]:  Organization  Address  Phone   Notes  Sickle Cell Patients, [facility] [address] [place], [place] [phone]   [facility] [address] [place], [place] [phone]    [facility] [place]  [address], [place] [phone]   [facility]/Dr. Osei-Bonsu  [address], [place] or [address], [place] [phone] Phone number for both [place] and [place] locations is the same.  [facility] [address], [place] [phone]   [facility] [address] [place] or [address] [phone]   [facility] [address] [place] [phone], phone; [phone], fax Sees patients 1st and 3rd Saturday of every month.  Must not qualify for public or private insurance (i.e. Medicaid, Medicare, [place] Health Choice, Veterans' Benefits)  Household income should be no more than 200% of the poverty level The clinic cannot treat you if you are pregnant or think you are pregnant  Sexually transmitted diseases are not treated at the clinic.    Dental Care: Organization         Address  Phone  Notes  [facility] Department of [facility] [address] [place], [place] [phone] Accepts children up to age 33 who are enrolled in [place] or [place] Health Choice; pregnant women with a Medicaid card; and children who have applied for Medicaid or [place] Health Choice, but were declined, whose parents can pay a reduced fee at time of service.  [facility] Department of [facility]  [address], [place] [phone] Accepts children up to age 50 who are enrolled in [place] or [place] Health Choice; pregnant women with a Medicaid card; and children who have applied for Medicaid or [place] Health Choice, but were declined, whose parents can pay a reduced fee at time of service.  [facility]  [address] [place], [place] [phone] Patients are seen by appointment only. Walk-ins are not accepted. [facility] will see patients 75  years of age and older. Monday - Tuesday (8am-5pm) Most Wednesdays (8:30-5pm) $30 per visit, cash only  [facility] Adult [facility]  [address], [facility] [phone] Patients are seen by appointment only. Walk-ins are not accepted. [facility] will see patients 6 years of age and older. One Wednesday Evening (Monthly: Volunteer Based).  $30 per visit, cash only  [employer]  [phone] for adults; Children under age 48, call Graduate Pediatric Dentistry at [phone]. Children aged 90-14, please call [phone] to request a pediatric application.  Dental services are provided in all areas of dental care including fillings, crowns and bridges, complete and partial dentures, implants, gum treatment, root canals, and extractions. Preventive care is also provided. Treatment is provided to both adults and children. Patients are selected via a lottery and there is often a waiting list.   [facility] [address] [place]  [phone] [email or web address]   [facility] [address] [place], [place] [phone], Ext. [phone] Second and Fourth Thursday of each month, opens at 6:30 AM; Clinic ends at 9 AM.  Patients are seen on a first-come first-served basis, and a limited number are seen during each clinic.   [facility]  [address] [place], [place]  [place], [place] [phone]   Eligibility Requirements You must have lived in [place], [place], or [place] counties for at least the last three months.   You cannot be eligible for state or federal sponsored [place], including [employer], [place], or [employer].   You generally cannot be eligible for healthcare insurance through your employer.    How to apply: Eligibility screenings are held every Tuesday and Wednesday afternoon from 1:00 pm until 4:00 pm. You do not need an appointment for the interview!  [facility]  [address] [place], [place] [phone]   [facility]  [phone]   [facility]  [phone]   [facility]  [phone]    Behavioral Health Resources in the Community: Intensive Outpatient Programs Organization         Address  Phone  Notes  [facility] [address], [place], [place] [phone]   [facility] [address] [place], [place] [phone]   [facility] [address] [place], [place]  [phone]   [facility] Mental Health [address]  [place], [place] [phone] or [phone]   Substance Abuse Resources Organization         Address  Phone  Notes  [facility]  [phone]   [facility]  [phone]   [facility] [place]  [phone]   Floydene Flock  [phone]   Residential & Outpatient Substance Abuse Program  ([phone]   Psychological Services Organization         Address  Phone  Notes  [facility] Behavioral Health  [phone]   [facility]  [phone]   [facility] [address] [place] [phone] or [phone]    Mobile Crisis Teams Organization         Address  Phone  Notes  [facility], Mobile Crisis Care Unit  [phone]   [facility]  [address] [place], [place] [phone]   Doristine Locks [address] [place] [phone]    Self-Help/Support Groups Organization         Address  Phone             Notes  [facility] [place] - variety of support groups  336- I7437963 Call for more information  Narcotics Anonymous (NA), [facility] [address], [employer] [place]  2 meetings at this location   Statistician         Address  Phone  Notes  [facility] [address],    [place]   [phone]   [facility]  [address] [place] [address], [place], [place] [phone]   [facility] Treatment Facility [address] [place], [place] [phone] Admissions: 8am-3pm M-F  [facility] [address]    [place], [place] [phone]   [facility] [address] Starling Manns [place], [place] [phone]   [facility] [address]  [place], [place] [phone]   [facility] - Intensive Outpatient [address] Laurell Josephs [address], [place], [place] [phone]   [facility] ([facility] [address] [place]  [place], [place] [phone] or [phone]   [facility] ([facility]) [address], [place], [place] [phone] Accepts Medicaid  [facility] [address],  [place] [phone] Substance Abuse/Addiction Treatment   [facility] Resources Organization  Address  Phone  Notes  °[facility]  ([phone]) [phone]   °Julie Brannon, PhD [address] [place], [place]   ([phone]) [phone] or (336) 951-0000   °[facility]   [address] °[place], [place] [phone]) [phone]   °[facility] [address], [place], [place] ([phone]) [phone] Insurance/Medicaid/sponsorship through [facility]  °[facility] [address]                                    [place], [place] [phone]) [phone] Therapy/tele-psych/case  °[facility] [address]  ° [place], [place] [phone]) [phone]    °Dr. Arfeen  ([phone]) [phone]   °[facility] [place]  [employer] [place] [facility] 1) [address], [place] °2) [address], [place] °3)  [address], [place] [phone]) [phone] °([phone]) [phone] ° °(336) [phone]   °[place] Child Abuse Hotline (336) 342-1394 or (336) [phone] (After Hours)    ° ° ° °

## 2015-06-29 ENCOUNTER — Emergency Department (HOSPITAL_COMMUNITY)
Admission: EM | Admit: 2015-06-29 | Discharge: 2015-06-29 | Disposition: A | Discharge status: Home / Self Care | Payer: Self-pay | Attending: Emergency Medicine | Admitting: Emergency Medicine

## 2015-06-29 ENCOUNTER — Encounter (HOSPITAL_COMMUNITY): Payer: Self-pay | Admitting: *Deleted

## 2015-06-29 DIAGNOSIS — I10 Essential (primary) hypertension: Secondary | ICD-10-CM | POA: Insufficient documentation

## 2015-06-29 DIAGNOSIS — Z4802 Encounter for removal of sutures: Secondary | ICD-10-CM | POA: Insufficient documentation

## 2015-06-29 DIAGNOSIS — Z792 Long term (current) use of antibiotics: Secondary | ICD-10-CM | POA: Insufficient documentation

## 2015-06-29 DIAGNOSIS — Z791 Long term (current) use of non-steroidal anti-inflammatories (NSAID): Secondary | ICD-10-CM | POA: Insufficient documentation

## 2015-06-29 NOTE — Discharge Instructions (Signed)
1. Medications: usual home medications 2. Treatment: rest, drink plenty of fluids 3. Follow Up: please followup with your primary doctor as needed; if you do not have a primary care doctor use the resource guide provided to find one; please return to the ER for severe pain or swelling, signs of infection (increased redness, swelling, heat), new or worsening symptoms   Suture Removal, Care After Refer to this sheet in the next few weeks. These instructions provide you with information on caring for yourself after your procedure. Your health care provider may also give you more specific instructions. Your treatment has been planned according to current medical practices, but problems sometimes occur. Call your health care provider if you have any problems or questions after your procedure. WHAT TO EXPECT AFTER THE PROCEDURE After your stitches (sutures) are removed, it is typical to have the following:  Some discomfort and swelling in the wound area.  Slight redness in the area. HOME CARE INSTRUCTIONS   If you have skin adhesive strips over the wound area, do not take the strips off. They will fall off on their own in a few days. If the strips remain in place after 14 days, you may remove them.  Change any bandages (dressings) at least once a day or as directed by your health care provider. If the bandage sticks, soak it off with warm, soapy water.  Apply cream or ointment only as directed by your health care provider. If using cream or ointment, wash the area with soap and water 2 times a day to remove all the cream or ointment. Rinse off the soap and pat the area dry with a clean towel.  Keep the wound area dry and clean. If the bandage becomes wet or dirty, or if it develops a bad smell, change it as soon as possible.  Continue to protect the wound from injury.  Use sunscreen when out in the sun. New scars become sunburned easily. SEEK MEDICAL CARE IF:  You have increasing redness,  swelling, or pain in the wound.  You see pus coming from the wound.  You have a fever.  You notice a bad smell coming from the wound or dressing.  Your wound breaks open (edges not staying together).   This information is not intended to replace advice given to you by your health care provider. Make sure you discuss any questions you have with your health care provider.   Document Released: 04/03/2001 Document Revised: 04/29/2013 Document Reviewed: 02/18/2013 Elsevier Interactive Patient Education 2016 [employer].   Emergency Department Resource Guide 1) Find a Doctor and Pay Out of Pocket Although you won't have to find out who is covered by your insurance plan, it is a good idea to ask around and get recommendations. You will then need to call the office and see if the doctor you have chosen will accept you as a new patient and what types of options they offer for patients who are self-pay. Some doctors offer discounts or will set up payment plans for their patients who do not have insurance, but you will need to ask so you aren't surprised when you get to your appointment.  2) Contact Your Local Health Department Not all health departments have doctors that can see patients for sick visits, but many do, so it is worth a call to see if yours does. If you don't know where your local health department is, you can check in your phone book. The CDC also has a tool  to help you locate your state's health department, and many state websites also have listings of all of their local health departments.  3) Find a Walk-in Clinic If your illness is not likely to be very severe or complicated, you may want to try a walk in clinic. These are popping up all over the country in pharmacies, drugstores, and shopping centers. They're usually staffed by nurse practitioners or physician assistants that have been trained to treat common illnesses and complaints. They're usually fairly quick and inexpensive.  However, if you have serious medical issues or chronic medical problems, these are probably not your best option.  No Primary Care Doctor: - Call Health Connect at  [phone] - they can help you locate a primary care doctor that  accepts your insurance, provides certain services, etc. - Physician Referral Service- [phone]  Chronic Pain Problems: Organization         Address  Phone   Notes  [facility]  [phone] Patients need to be referred by their primary care doctor.   Medication Assistance: Organization         Address  Phone   Notes  [facility] Medication [facility] [address] [place], [place] [address] [phone] --Must be a resident of [facility] -- Must have NO insurance coverage whatsoever (no Medicaid/ Medicare, etc.) -- The pt. MUST have a primary care doctor that directs their care regularly and follows them in the community   [facility]ist  [phone]   [employer]  [phone]    Agencies that provide inexpensive medical care: Organization         Address  Phone   Notes  [facility]  [phone]   [facility]    [phone]   [facility] [address] [place], [place] [address] [phone]   [facility] [address] [place], [place] [phone]   Planned Parenthood    [phone]   [facility]    [phone]   Community Health and [facility]  [address], [place] Phone:  [phone], Fax:  [phone] Hours of Operation:  9 am - 6 pm, M-F.  Also accepts Medicaid/Medicare and self-pay.  [facility]  [address], [place] Phone: [phone], Fax: [phone]. Hours of Operation:  8:30 am - 5:30 pm, M-F.  Also accepts Medicaid and self-pay.  [facility] [place] [address] [place] Phone: [phone]   [facility] [address] Natasha Bence [place], [place] [phone], Ext. [phone] Mondays & Thursdays: 7-9 AM.  First 15 patients are seen on a first come, first serve basis.    Medicaid-accepting [facility] Providers:  Organization         Address  Phone   Notes  [facility] [address], [place] [phone] Also accepts self-pay patients.  [facility] [address] Laurell Josephs [place], [place]  [phone]   [facility] [address], [place] [phone]   [facility] [address] [place] [phone]   Renaye Rakers [address], [place]   [phone] Only accepts [place] patients after they have their name applied to their card.   Self-Pay (no insurance) in [place]:  Organization         Address  Phone   Notes  Sickle Cell Patients, [facility] [address], [place] [phone]   [facility] [address], [place] [phone]) [phone]   [facility] [place]  [address], [place] [phone]   [facility]/Dr. Osei-Bonsu  [address] [place] [address], [place] or [phone], [place] [phone] Phone number for both [place] and [place] locations is the same.  Urgent Medical and [facility] [address], [place] [phone]) [phone]   [facility] [place] [address] [place] [address], [place] or [address] [phone]   [facility] [address], [place] [phone]) [phone], phone; [phone]) [phone], fax Sees patients 1st and 3rd Saturday of every month.  Must not qualify for public or private insurance (i.e. Medicaid, Medicare, [place] Health Choice, Veterans' Benefits)  Household income should be no more than 200% of the poverty level The clinic cannot treat you if you are pregnant or think  you are pregnant  Sexually transmitted diseases are not treated at the clinic.    Dental Care: Organization         Address  Phone  Notes  [facility] [address], [place] [phone] Accepts children up to age 25 who are enrolled in [place] or [place] Health Choice; pregnant women with a Medicaid card; and children who have applied for Medicaid or [place] Health Choice, but were declined, whose parents can pay a reduced fee at time of service.  [facility] [place]  [address], [place] [phone] Accepts children up to age 25 who are enrolled in [place] or [place] Health Choice; pregnant women with a Medicaid card; and children who have applied for Medicaid or  Health Choice, but were declined, whose parents can pay a reduced fee at time of service.  [facility]  [address], [place] [phone] Patients are seen by appointment only. Walk-ins are not accepted. [facility] will see patients 25 years of age and older. Monday - Tuesday (8am-5pm) Most Wednesdays (8:30-5pm) $30 per visit, cash only  [facility]  [address], [place] [phone] Patients are seen by appointment only. Walk-ins are not accepted. [facility] will see patients 25 years of age and older. One Wednesday Evening (Monthly: Volunteer Based).  $30 per visit, cash only  [facility]  [phone]) [phone] for adults; Children under age 604, call Graduate Pediatric Dentistry at [phone]) [phone]. Children aged 674-14, please call [phone]) [phone] to request a pediatric application.  Dental services are provided in all areas of dental care including fillings, crowns and bridges, complete and partial dentures, implants, gum treatment, root canals, and extractions. Preventive care is also provided. Treatment is provided to both adults and  children. Patients are selected via a lottery and there is often a waiting list.   [facility] [address], [place]  [phone] [email or web address]   [facility] [address], [place], [place] [phone], Ext. [phone] Second and Fourth Thursday of each month, opens at 6:30 AM; Clinic ends at 9 AM.  Patients are seen on a first-come first-served basis, and a limited number are seen during each clinic.  [facility]  [address] Ether [place]ins [place], [place] [phone]   Eligibility Requirements You must have lived in [place], [place], or [place] counties for at least the last three months.   You cannot be eligible for state or federal sponsored [place], including [employer], [place], or [employer].   You generally cannot be eligible for healthcare insurance through your employer.    How to apply: Eligibility screenings are held every Tuesday and Wednesday afternoon from 1:00 pm until 4:00 pm. You do not need an appointment for the interview!  [facility] [address], [place], [place] [phone]   [facility] Health Department  [phone]   [facility] Health Department  [phone]   [facility] Health Department  [phone]    Behavioral Health Resources in the Community: Intensive Outpatient Programs Organization         Address  Phone  Notes  [facility] [address], [place], [place] [phone]   [facility]patient [address] [place], [place] [phone]   [facility] [address], [place], [place]  [phone]   [facility] [address]  [place], [place] [phone] or [phone]   Substance Abuse Resources Organization         Address  Phone  Notes  [facility]  [phone]   [facility]  [phone]   [facility]  [phone]     Floydene Flock  [phone]   Residential & Outpatient Substance Abuse Program  [phone]   Psychological Services Organization         Address  Phone  Notes  [facility]  [phone]   [facility]  [phone]   [facility] Health [address] [place] [phone] or [phone]    Mobile Crisis Teams Organization         Address  Phone  Notes  [facility], Mobile Crisis Care Unit  [phone]   [facility]  [address] [place], [place] [phone]   Doristine Locks [address] [place] [phone]    Self-Help/Support Groups Organization         Address  Phone             Notes  [facility] [place] - variety of support groups  336- I7437963 Call for more information  Narcotics Anonymous (NA), [facility] [address], [employer] [place]  2 meetings at this location   Statistician         Address  Phone  Notes  [facility] [address],    [place]  [phone]   [facility]  [address] [place] [address], [place], [place] [phone]   [facility] [address] [place], [place] [phone] Admissions: 8am-3pm M-F  [facility] [address]    [place], [place] [phone]   [facility] [address] [place], [place] [phone]   [facility] [address]  [place], [place] [phone]   [facility] - Intensive Outpatient [address]ure[address], [place], [place] [phone]   [facility] ([facility]iction [facility] [address]  [place], [place] [phone] or [phone]   [facility] ([facility]) [address], [place], [place] [phone] Accepts Medicaid  [facility] [address],  [place] [phone] Substance Abuse/Addiction  Treatment   [facility] Organization         Address  Phone  Notes  [facility]  [phone]   Angie Fava, PhD [address] Ervin Knack [place], [place]   [phone] or [phone]   [facility] Behavioral   [address] [place], [place] [phone]   [facility] [address] [place], [place] [phone] Insurance/Medicaid/sponsorship through [facility] and Families [address]                                    [place], [place] [phone] Therapy/tele-psych/case  [facility] [address], [place] [phone]    Dr. Lolly Mustache  [phone]   [facility]  [employer] [facility] Dept. 1) [address], [place] 2) [address] [place] 3)  [address], [place] [phone]  [phone]   [facility] Child Abuse Hotline (414)[phone] or [phone] (After Hours)

## 2015-06-29 NOTE — ED Notes (Signed)
Here for suture remoival  Well healed

## 2015-06-29 NOTE — ED Provider Notes (Signed)
CSN: [id number]     Arrival date & time 06/29/15  1950 History  By signing my name below, I, [name], attest that this documentation has been prepared under the direction and in the presence of Eric GemmaElizabeth C Westfall, PA-C. Electronically Signed: Evon Slackerrance Schneider, ED Scribe. 06/29/2015. 11:08 PM.     Chief Complaint  Patient presents with  . Suture / Staple Removal    The history is provided by the patient. No language interpreter was used.    HPI Comments: [name] is a 24 y.o. male who presents to the Emergency Department for removal of sutures in his right lower leg that were placed 1 week prior. He states that the wound is healing well. He reports he has noticed some swelling in his right lower leg. He states that he initially injured himself when he slipped and cut his leg on a piece of metal. He denies fever, chills, redness, discharge, numbness, weakness, paresthesia.  Past Medical History  Diagnosis Date  . Hypertension    History reviewed. No pertinent past surgical history. Family History  Problem Relation Age of Onset  . Hypertension Brother   . Hypertension Other    Social History  Substance Use Topics  . Smoking status: Never Smoker   . Smokeless tobacco: Never Used  . Alcohol Use: No     Review of Systems  Constitutional: Negative for fever and chills.  Skin: Positive for wound. Negative for color change.  Neurological: Negative for weakness and numbness.      Allergies  Review of patient's allergies indicates no known allergies.  Home Medications   Prior to Admission medications   Medication Sig Start Date End Date Taking? Authorizing Provider  azithromycin (ZITHROMAX) 250 MG tablet Take 1 tablet (250 mg total) by mouth daily. 10/11/14   Tomasita CrumbleAdeleke Oni, MD  cephALEXin (KEFLEX) 500 MG capsule Take 1 capsule (500 mg total) by mouth 4 (four) times daily. Patient not taking: Reported on 10/18/2014 09/08/14   Joni ReiningNicole Pisciotta, PA-C  ibuprofen  (ADVIL,MOTRIN) 800 MG tablet Take 1 tablet (800 mg total) by mouth 3 (three) times daily. 06/21/15   Hanna Patel-Mills, PA-C  naproxen (NAPROSYN) 500 MG tablet Take 1 tablet (500 mg total) by mouth 2 (two) times daily with a meal. Patient not taking: Reported on 10/18/2014 08/17/13   Kaitlyn Szekalski, PA-C    BP 134/71 mmHg  Pulse 60  Temp(Src) 98.1 F (36.7 C) (Oral)  Resp 18  SpO2 100%  Physical Exam  Constitutional: He is oriented to person, place, and time. He appears well-developed and well-nourished. No distress.  HENT:  Head: Normocephalic and atraumatic.  Right Ear: External ear normal.  Left Ear: External ear normal.  Nose: Nose normal.  Eyes: Conjunctivae and EOM are normal. Right eye exhibits no discharge. Left eye exhibits no discharge. No scleral icterus.  Neck: Normal range of motion. Neck supple.  Cardiovascular: Normal rate, regular rhythm and intact distal pulses.   Pulmonary/Chest: Effort normal and breath sounds normal. No respiratory distress.  Musculoskeletal: Normal range of motion. He exhibits no edema or tenderness.  No significant edema or erythema to right anterior shin. No TTP. Distal pulses intact. Strength and sensation intact.  Neurological: He is alert and oriented to person, place, and time.  Skin: Skin is warm and dry. He is not diaphoretic.     Well healing wound to right anterior shin. No erythema, discharge, or heat. No evidence of infection.  Psychiatric: He has a normal mood and affect.  His behavior is normal.  Nursing note and vitals reviewed.   ED Course  Procedures (including critical care time)  SUTURE REMOVAL Performed by: Eric Gemma, PA-C Consent: Verbal consent obtained. Patient identity confirmed: provided demographic data Time out: Immediately prior to procedure a "time out" was called to verify the correct patient, procedure, equipment, support staff and site/side marked as required. Location: right anterior shin Wound  Appearance: clean Sutures/Staples Removed: 4 Patient tolerance: Patient tolerated the procedure well with no immediate complications.   DIAGNOSTIC STUDIES: Oxygen Saturation is 100% on RA, normal by my interpretation.    COORDINATION OF CARE: 11:08 PM-Discussed treatment plan with pt at bedside and pt agreed to plan.    Labs Review Labs Reviewed - No data to display  Imaging Review No results found.    EKG Interpretation None      MDM   Final diagnoses:  Visit for suture removal    24 year old male presents for suture removal. He states he was evaluated in the ED 1 week ago for right anterior shin laceration. He reports mild swelling, though denies pain, redness, heat, discharge, fever, chills. He states he has been applying ointment to his wound as directed.  Patient is afebrile. Vital signs stable. Laceration to right shin appears well-healed and is clean, dry, and intact. No significant edema or erythema. No signs of infection. Distal pulses intact. Strength and sensation intact.  Per record review, appears patient had 4 sutures placed. 4 sutures removed, which the patient tolerated well. Patient to follow-up with PCP as needed. Resource list given. Return precautions discussed. Patient verbalizes his understanding and is in agreement with plan.  BP 134/71 mmHg  Pulse 60  Temp(Src) 98.1 F (36.7 C) (Oral)  Resp 18  SpO2 100%  I personally performed the services described in this documentation, which was scribed in my presence. The recorded information has been reviewed and is accurate.    Eric Gemma, PA-C 06/29/15 2308  Dione Booze, MD 06/29/15 [phone]

## 2019-04-15 ENCOUNTER — Encounter (HOSPITAL_COMMUNITY): Payer: Self-pay | Admitting: *Deleted

## 2019-04-15 ENCOUNTER — Emergency Department (HOSPITAL_COMMUNITY): Payer: Self-pay

## 2019-04-15 ENCOUNTER — Emergency Department (HOSPITAL_COMMUNITY)
Admission: EM | Admit: 2019-04-15 | Discharge: 2019-04-15 | Disposition: A | Discharge status: Home / Self Care | Payer: Self-pay | Attending: Emergency Medicine | Admitting: Emergency Medicine

## 2019-04-15 DIAGNOSIS — I10 Essential (primary) hypertension: Secondary | ICD-10-CM | POA: Insufficient documentation

## 2019-04-15 DIAGNOSIS — Z20828 Contact with and (suspected) exposure to other viral communicable diseases: Secondary | ICD-10-CM | POA: Insufficient documentation

## 2019-04-15 DIAGNOSIS — R059 Cough, unspecified: Secondary | ICD-10-CM

## 2019-04-15 DIAGNOSIS — R0602 Shortness of breath: Secondary | ICD-10-CM | POA: Insufficient documentation

## 2019-04-15 DIAGNOSIS — R05 Cough: Secondary | ICD-10-CM | POA: Insufficient documentation

## 2019-04-15 MED ORDER — BENZONATATE 100 MG PO CAPS: 100.0000 mg | ORAL_CAPSULE | Freq: Three times a day (TID) | ORAL | 0 refills | Status: DC

## 2019-04-15 NOTE — Discharge Instructions (Addendum)
Your chest x-ray was clear.  You will be called if you test positive for COVID-19 (you can check MyChart if you do not hear anything, most likely negative).  I suspect you have some type of viral upper respiratory infection.  Take Tessalon every 8 hours as needed for cough.  Try to rest as much as possible.  Stay hydrated.  Do not return to work or normal activities until your symptoms have been resolved for 3 days.  Please return to the emergency department if you develop any new or worsening symptoms including worsening shortness of breath, wheezing, chest pain.

## 2019-04-15 NOTE — ED Provider Notes (Signed)
[facility] EMERGENCY DEPARTMENT Provider Note   CSN: [id number] Arrival date & time: 04/15/19  1047     History   Chief Complaint Chief Complaint  Patient presents with  . Shortness of Breath  . Cough    HPI [name] is a 28 y.o. male with history of hypertension who presents with a 2-day history of productive cough and shortness of breath.  Patient describes his shortness of breath is worse with lying down and better with sitting up.  He denies any chest pain, abdominal pain and nausea, vomiting, sore throat, ear pain, loss of taste or smell fever.  He has been around a coworker who has had similar symptoms.  His work wanted him to get a COVID test before returning to work.  No medications taken prior to arrival.     HPI  Past Medical History:  Diagnosis Date  . Hypertension     There are no active problems to display for this patient.   History reviewed. No pertinent surgical history.      Home Medications    Prior to Admission medications   Medication Sig Start Date End Date Taking? Authorizing Provider  azithromycin (ZITHROMAX) 250 MG tablet Take 1 tablet (250 mg total) by mouth daily. 10/11/14   Tomasita Crumble, MD  benzonatate (TESSALON) 100 MG capsule Take 1 capsule (100 mg total) by mouth every 8 (eight) hours. 04/15/19   Taygen Acklin, Waylan Boga, PA-C  cephALEXin (KEFLEX) 500 MG capsule Take 1 capsule (500 mg total) by mouth 4 (four) times daily. Patient not taking: Reported on 10/18/2014 09/08/14   Pisciotta, Joni Reining, PA-C  ibuprofen (ADVIL,MOTRIN) 800 MG tablet Take 1 tablet (800 mg total) by mouth 3 (three) times daily. 06/21/15   Patel-Mills, Lorelle Formosa, PA-C  naproxen (NAPROSYN) 500 MG tablet Take 1 tablet (500 mg total) by mouth 2 (two) times daily with a meal. Patient not taking: Reported on 10/18/2014 08/17/13   Emilia Beck, PA-C    Family History Family History  Problem Relation Age of Onset  . Hypertension Brother   . Hypertension  Other     Social History Social History   Tobacco Use  . Smoking status: Never Smoker  . Smokeless tobacco: Never Used  Substance Use Topics  . Alcohol use: No  . Drug use: No     Allergies   Patient has no known allergies.   Review of Systems Review of Systems  Constitutional: Negative for fever.  HENT: Negative for ear pain and sore throat.   Respiratory: Positive for cough (productive) and shortness of breath.   Gastrointestinal: Negative for abdominal pain, nausea and vomiting.     Physical Exam Updated Vital Signs BP 119/71 (BP Location: Right Arm)   Pulse (!) 57   Temp 98.3 F (36.8 C) (Oral)   Resp 18   SpO2 100%   Physical Exam Vitals signs and nursing note reviewed.  Constitutional:      General: He is not in acute distress.    Appearance: He is well-developed. He is not diaphoretic.  HENT:     Head: Normocephalic and atraumatic.     Mouth/Throat:     Pharynx: No oropharyngeal exudate.  Eyes:     General: No scleral icterus.       Right eye: No discharge.        Left eye: No discharge.     Conjunctiva/sclera: Conjunctivae normal.     Pupils: Pupils are equal, round, and reactive to light.  Neck:  Musculoskeletal: Normal range of motion and neck supple.     Thyroid: No thyromegaly.  Cardiovascular:     Rate and Rhythm: Normal rate and regular rhythm.     Heart sounds: Normal heart sounds. No murmur. No friction rub. No gallop.   Pulmonary:     Effort: Pulmonary effort is normal. No respiratory distress.     Breath sounds: Normal breath sounds. No stridor. No decreased breath sounds, wheezing or rales.  Abdominal:     General: Bowel sounds are normal. There is no distension.     Palpations: Abdomen is soft.     Tenderness: There is no abdominal tenderness. There is no guarding or rebound.  Lymphadenopathy:     Cervical: No cervical adenopathy.  Skin:    General: Skin is warm and dry.     Coloration: Skin is not pale.     Findings: No  rash.  Neurological:     Mental Status: He is alert.     Coordination: Coordination normal.      ED Treatments / Results  Labs (all labs ordered are listed, but only abnormal results are displayed) Labs Reviewed  NOVEL CORONAVIRUS, NAA (HOSP ORDER, SEND-OUT TO REF LAB; TAT 18-24 HRS)    EKG None  Radiology Dg Chest Portable 1 View  Result Date: 04/15/2019 CLINICAL DATA:  Cough and shortness of breath EXAM: PORTABLE CHEST 1 VIEW COMPARISON:  October 11, 2014 FINDINGS: The heart size and mediastinal contours are within normal limits. Both lungs are clear. The visualized skeletal structures are unremarkable. IMPRESSION: No acute cardiopulmonary process. Electronically Signed   By: Prudencio Pair M.D.   On: 04/15/2019 12:50    Procedures Procedures (including critical care time)  Medications Ordered in ED Medications - No data to display   Initial Impression / Assessment and Plan / ED Course  I have reviewed the triage vital signs and the nursing notes.  Pertinent labs & imaging results that were available during my care of the patient were reviewed by me and considered in my medical decision making (see chart for details).        Pt symptoms consistent with viral URI. CXR negative for acute infiltrate.  Possible COVID-19 infection, testing pending.  Patient oxygen saturation at 100%.  He is in no acute distress.  Will treat with Tessalon.  Isolation at home discussed until symptoms resolved for 3 days and COVID test negative.  Discussed return precautions.  Pt is hemodynamically stable & in NAD prior to discharge.   [place] A Henery was evaluated in Emergency Department on 04/15/2019 for the symptoms described in the history of present illness. He was evaluated in the context of the global COVID-19 pandemic, which necessitated consideration that the patient might be at risk for infection with the SARS-CoV-2 virus that causes COVID-19. Institutional protocols and algorithms that  pertain to the evaluation of patients at risk for COVID-19 are in a state of rapid change based on information released by regulatory bodies including the CDC and federal and state organizations. These policies and algorithms were followed during the patient's care in the ED.  Final Clinical Impressions(s) / ED Diagnoses   Final diagnoses:  Cough  Shortness of breath    ED Discharge Orders         Ordered    benzonatate (TESSALON) 100 MG capsule  Every 8 hours     04/15/19 1305           Frederica Kuster, PA-C 04/15/19 1613  Margarita Grizzle, MD 04/16/19 [phone]

## 2019-04-15 NOTE — ED Triage Notes (Signed)
To ED for eval of cough and sob while at work today. Pt states he was told to come to the ED for covid test before returning to work.

## 2019-04-16 LAB — NOVEL CORONAVIRUS, NAA (HOSP ORDER, SEND-OUT TO REF LAB; TAT 18-24 HRS): SARS-CoV-2, NAA: NOT DETECTED

## 2020-02-19 ENCOUNTER — Emergency Department (HOSPITAL_COMMUNITY)
Admission: EM | Admit: 2020-02-19 | Discharge: 2020-02-19 | Disposition: A | Discharge status: Home / Self Care | Payer: Self-pay | Attending: Emergency Medicine | Admitting: Emergency Medicine

## 2020-02-19 ENCOUNTER — Other Ambulatory Visit: Payer: Self-pay

## 2020-02-19 ENCOUNTER — Emergency Department (HOSPITAL_COMMUNITY): Admission: EM | Admit: 2020-02-19 | Discharge: 2020-02-19 | Discharge status: Against Medical Advice | Payer: Self-pay

## 2020-02-19 ENCOUNTER — Encounter (HOSPITAL_COMMUNITY): Payer: Self-pay

## 2020-02-19 DIAGNOSIS — Y999 Unspecified external cause status: Secondary | ICD-10-CM | POA: Insufficient documentation

## 2020-02-19 DIAGNOSIS — Y92410 Unspecified street and highway as the place of occurrence of the external cause: Secondary | ICD-10-CM | POA: Insufficient documentation

## 2020-02-19 DIAGNOSIS — Y939 Activity, unspecified: Secondary | ICD-10-CM | POA: Insufficient documentation

## 2020-02-19 DIAGNOSIS — Z5321 Procedure and treatment not carried out due to patient leaving prior to being seen by health care provider: Secondary | ICD-10-CM | POA: Insufficient documentation

## 2020-02-19 DIAGNOSIS — M549 Dorsalgia, unspecified: Secondary | ICD-10-CM | POA: Insufficient documentation
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
## 2020-02-20 ENCOUNTER — Other Ambulatory Visit: Payer: Self-pay

## 2020-02-20 DIAGNOSIS — R0602 Shortness of breath: Secondary | ICD-10-CM | POA: Insufficient documentation

## 2020-02-20 DIAGNOSIS — R0789 Other chest pain: Secondary | ICD-10-CM | POA: Insufficient documentation

## 2020-02-20 DIAGNOSIS — Y999 Unspecified external cause status: Secondary | ICD-10-CM | POA: Insufficient documentation

## 2020-02-20 DIAGNOSIS — R109 Unspecified abdominal pain: Secondary | ICD-10-CM | POA: Insufficient documentation

## 2020-02-20 DIAGNOSIS — M542 Cervicalgia: Secondary | ICD-10-CM | POA: Insufficient documentation

## 2020-02-20 DIAGNOSIS — I10 Essential (primary) hypertension: Secondary | ICD-10-CM | POA: Insufficient documentation

## 2020-02-20 DIAGNOSIS — Z79899 Other long term (current) drug therapy: Secondary | ICD-10-CM | POA: Insufficient documentation

## 2020-02-20 DIAGNOSIS — Y92410 Unspecified street and highway as the place of occurrence of the external cause: Secondary | ICD-10-CM | POA: Insufficient documentation

## 2020-02-20 DIAGNOSIS — S0990XA Unspecified injury of head, initial encounter: Secondary | ICD-10-CM | POA: Insufficient documentation

## 2020-02-20 DIAGNOSIS — Y9389 Activity, other specified: Secondary | ICD-10-CM | POA: Insufficient documentation

## 2020-02-21 ENCOUNTER — Encounter (HOSPITAL_COMMUNITY): Payer: Self-pay | Admitting: Emergency Medicine

## 2020-02-21 ENCOUNTER — Other Ambulatory Visit: Payer: Self-pay

## 2020-02-21 ENCOUNTER — Emergency Department (HOSPITAL_COMMUNITY): Payer: Self-pay

## 2020-02-21 ENCOUNTER — Emergency Department (HOSPITAL_COMMUNITY)
Admission: EM | Admit: 2020-02-21 | Discharge: 2020-02-21 | Disposition: A | Discharge status: Home / Self Care | Payer: Self-pay | Attending: Emergency Medicine | Admitting: Emergency Medicine

## 2020-02-21 LAB — COMPREHENSIVE METABOLIC PANEL
ALT: 22 U/L (ref 0–44)
AST: 28 U/L (ref 15–41)
Albumin: 4.3 g/dL (ref 3.5–5.0)
Alkaline Phosphatase: 48 U/L (ref 38–126)
Anion gap: 8 (ref 5–15)
BUN: 8 mg/dL (ref 6–20)
CO2: 28 mmol/L (ref 22–32)
Calcium: 9.1 mg/dL (ref 8.9–10.3)
Chloride: 102 mmol/L (ref 98–111)
Creatinine, Ser: 1.21 mg/dL (ref 0.61–1.24)
GFR calc Af Amer: >60 mL/min (ref >60)
GFR calc non Af Amer: >60 mL/min (ref >60)
Glucose, Bld: 79 mg/dL (ref 70–99)
Potassium: 4 mmol/L (ref 3.5–5.1)
Sodium: 138 mmol/L (ref 135–145)
Total Bilirubin: 1.7 mg/dL — ABNORMAL HIGH (ref 0.3–1.2)
Total Protein: 7.5 g/dL (ref 6.5–8.1)

## 2020-02-21 LAB — CBC
HCT: 43.7 % (ref 39.0–52.0)
Hemoglobin: 14.9 g/dL (ref 13.0–17.0)
MCH: 29.9 pg (ref 26.0–34.0)
MCHC: 34.1 g/dL (ref 30.0–36.0)
MCV: 87.8 fL (ref 80.0–100.0)
Platelets: 203 10*3/uL (ref 150–400)
RBC: 4.98 MIL/uL (ref 4.22–5.81)
RDW: 12.6 % (ref 11.5–15.5)
WBC: 5.5 10*3/uL (ref 4.0–10.5)
nRBC: 0 % (ref 0.0–0.2)

## 2020-02-21 LAB — I-STAT CHEM 8, ED
BUN: 6 mg/dL (ref 6–20)
Calcium, Ion: 1.25 mmol/L (ref 1.15–1.40)
Chloride: 101 mmol/L (ref 98–111)
Creatinine, Ser: 1.2 mg/dL (ref 0.61–1.24)
Glucose, Bld: 110 mg/dL — ABNORMAL HIGH (ref 70–99)
HCT: 40 % (ref 39.0–52.0)
Hemoglobin: 13.6 g/dL (ref 13.0–17.0)
Potassium: 3.8 mmol/L (ref 3.5–5.1)
Sodium: 141 mmol/L (ref 135–145)
TCO2: 26 mmol/L (ref 22–32)

## 2020-02-21 LAB — ETHANOL: Alcohol, Ethyl (B): <10 mg/dL (ref <10)

## 2020-02-21 MED ORDER — IBUPROFEN 800 MG PO TABS
800.0000 mg | ORAL_TABLET | Freq: Three times a day (TID) | ORAL | 0 refills | Status: DC
Start: 2020-02-21 — End: 2020-08-05

## 2020-02-21 MED ORDER — IOHEXOL 300 MG/ML  SOLN
100.0000 mL | Freq: Once | INTRAMUSCULAR | Status: AC | PRN
  Administered 2020-02-21: 100 mL via INTRAVENOUS

## 2020-02-21 MED ORDER — SODIUM CHLORIDE (PF) 0.9 % IJ SOLN
INTRAMUSCULAR | Status: AC
  Filled 2020-02-21: qty 50

## 2020-02-21 MED ORDER — CYCLOBENZAPRINE HCL 10 MG PO TABS
10.0000 mg | ORAL_TABLET | Freq: Two times a day (BID) | ORAL | 0 refills | Status: DC | PRN
Start: 2020-02-21 — End: 2020-08-05

## 2020-02-21 MED ORDER — FENTANYL CITRATE (PF) 100 MCG/2ML IJ SOLN
50.0000 ug | Freq: Once | INTRAMUSCULAR | Status: AC
  Administered 2020-02-21: 50 ug via INTRAVENOUS
  Filled 2020-02-21: qty 2
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
## 2020-07-18 ENCOUNTER — Ambulatory Visit (HOSPITAL_COMMUNITY)
Admission: EM | Admit: 2020-07-18 | Discharge: 2020-07-18 | Disposition: A | Discharge status: Home / Self Care | Payer: BC Managed Care – PPO | Attending: Family Medicine | Admitting: Family Medicine

## 2020-07-18 ENCOUNTER — Other Ambulatory Visit: Payer: Self-pay

## 2020-07-18 DIAGNOSIS — Z20822 Contact with and (suspected) exposure to covid-19: Secondary | ICD-10-CM | POA: Diagnosis not present

## 2020-07-18 LAB — SARS CORONAVIRUS 2 (TAT 6-24 HRS): SARS Coronavirus 2: NEGATIVE

## 2020-07-18 NOTE — ED Triage Notes (Signed)
Pt reports needing to be tested for COVID due to an exposure at work place. Pt reports no sxs.

## 2020-07-27 ENCOUNTER — Ambulatory Visit (HOSPITAL_COMMUNITY)
Admission: EM | Admit: 2020-07-27 | Discharge: 2020-07-27 | Disposition: A | Discharge status: Home / Self Care | Payer: BC Managed Care – PPO

## 2020-08-05 ENCOUNTER — Encounter (HOSPITAL_COMMUNITY): Payer: Self-pay | Admitting: Emergency Medicine

## 2020-08-05 ENCOUNTER — Ambulatory Visit (HOSPITAL_COMMUNITY)
Admission: EM | Admit: 2020-08-05 | Discharge: 2020-08-05 | Disposition: A | Discharge status: Home / Self Care | Payer: BC Managed Care – PPO | Attending: Internal Medicine | Admitting: Internal Medicine

## 2020-08-05 ENCOUNTER — Other Ambulatory Visit: Payer: Self-pay

## 2020-08-05 DIAGNOSIS — R509 Fever, unspecified: Secondary | ICD-10-CM | POA: Diagnosis not present

## 2020-08-05 DIAGNOSIS — J020 Streptococcal pharyngitis: Secondary | ICD-10-CM | POA: Diagnosis not present

## 2020-08-05 DIAGNOSIS — J029 Acute pharyngitis, unspecified: Secondary | ICD-10-CM

## 2020-08-05 LAB — POCT RAPID STREP A, ED / UC: Streptococcus, Group A Screen (Direct): POSITIVE — AB

## 2020-08-05 MED ORDER — PENICILLIN G BENZATHINE 1200000 UNIT/2ML IM SUSP
INTRAMUSCULAR | Status: AC
  Filled 2020-08-05: qty 2

## 2020-08-05 MED ORDER — PENICILLIN G BENZATHINE 1200000 UNIT/2ML IM SUSP
1.2000 10*6.[IU] | Freq: Once | INTRAMUSCULAR | Status: AC
  Administered 2020-08-05: 1.2 10*6.[IU] via INTRAMUSCULAR

## 2020-08-05 MED ORDER — IBUPROFEN 600 MG PO TABS: 600.0000 mg | ORAL_TABLET | Freq: Four times a day (QID) | ORAL | 0 refills | Status: DC | PRN

## 2020-08-05 NOTE — ED Provider Notes (Signed)
[facility]    CSN: [id number] Arrival date & time: 08/05/20  0840      History   Chief Complaint Chief Complaint  Patient presents with  . Sore Throat  . Fever    HPI [name] is a 30 y.o. male.   Patient presents with 1 week history of fever and sore throat.  T-max 101.  He also reports mild nonproductive cough.  Treatment at home with NyQuil; no medication taken today.  He denies rash, shortness of breath, vomiting, diarrhea, or other symptoms.  Medical history includes hypertension.  The history is provided by the patient and medical records.    Past Medical History:  Diagnosis Date  . Hypertension     There are no problems to display for this patient.   History reviewed. No pertinent surgical history.     Home Medications    Prior to Admission medications   Medication Sig Start Date End Date Taking? Authorizing Provider  ibuprofen (ADVIL) 600 MG tablet Take 1 tablet (600 mg total) by mouth every 6 (six) hours as needed. 08/05/20  Yes Mickie Bail, NP  cephALEXin (KEFLEX) 500 MG capsule Take 1 capsule (500 mg total) by mouth 4 (four) times daily. Patient not taking: No sig reported 09/08/14   Pisciotta, Joni Reining, PA-C    Family History Family History  Problem Relation Age of Onset  . Hypertension Brother   . Hypertension Other     Social History Social History   Tobacco Use  . Smoking status: Never Smoker  . Smokeless tobacco: Never Used  Substance Use Topics  . Alcohol use: No  . Drug use: No     Allergies   Patient has no known allergies.   Review of Systems Review of Systems  Constitutional: Positive for fever. Negative for chills.  HENT: Positive for sore throat. Negative for ear pain.   Eyes: Negative for pain and visual disturbance.  Respiratory: Positive for cough. Negative for shortness of breath.   Cardiovascular: Negative for chest pain and palpitations.  Gastrointestinal: Negative for abdominal pain, diarrhea  and vomiting.  Genitourinary: Negative for dysuria and hematuria.  Musculoskeletal: Negative for arthralgias and back pain.  Skin: Negative for color change and rash.  Neurological: Negative for seizures and syncope.  All other systems reviewed and are negative.    Physical Exam Triage Vital Signs ED Triage Vitals  Enc Vitals Group     BP 08/05/20 0905 138/74     Pulse Rate 08/05/20 0905 82     Resp 08/05/20 0905 15     Temp 08/05/20 0905 100.2 F (37.9 C)     Temp Source 08/05/20 0905 Oral     SpO2 08/05/20 0905 98 %     Weight 08/05/20 0903 171 lb (77.6 kg)     Height 08/05/20 0903 5\' 11"  (1.803 m)     Head Circumference --      Peak Flow --      Pain Score 08/05/20 0902 10     Pain Loc --      Pain Edu? --      Excl. in GC? --    No data found.  Updated Vital Signs BP 138/74 (BP Location: Right Arm)   Pulse 82   Temp 100.2 F (37.9 C) (Oral)   Resp 15   Ht 5\' 11"  (1.803 m)   Wt 171 lb (77.6 kg)   SpO2 98%   BMI 23.85 kg/m   Visual Acuity Right Eye  Distance:   Left Eye Distance:   Bilateral Distance:    Right Eye Near:   Left Eye Near:    Bilateral Near:     Physical Exam Vitals and nursing note reviewed.  Constitutional:      General: He is not in acute distress.    Appearance: He is well-developed and well-nourished.  HENT:     Head: Normocephalic and atraumatic.     Right Ear: Tympanic membrane normal.     Left Ear: Tympanic membrane normal.     Nose: Nose normal.     Mouth/Throat:     Mouth: Mucous membranes are moist.     Pharynx: Oropharyngeal exudate and posterior oropharyngeal erythema present.     Tonsils: 1+ on the right. 1+ on the left.     Comments: Speech clear. No difficulty swallowing.  Eyes:     Conjunctiva/sclera: Conjunctivae normal.  Cardiovascular:     Rate and Rhythm: Normal rate and regular rhythm.     Heart sounds: Normal heart sounds.  Pulmonary:     Effort: Pulmonary effort is normal. No respiratory distress.      Breath sounds: Normal breath sounds.  Abdominal:     Palpations: Abdomen is soft.     Tenderness: There is no abdominal tenderness.  Musculoskeletal:        General: No edema.     Cervical back: Neck supple.  Skin:    General: Skin is warm and dry.  Neurological:     General: No focal deficit present.     Mental Status: He is alert and oriented to person, place, and time.  Psychiatric:        Mood and Affect: Mood and affect and mood normal.        Behavior: Behavior normal.      UC Treatments / Results  Labs (all labs ordered are listed, but only abnormal results are displayed) Labs Reviewed  POCT RAPID STREP A, ED / UC - Abnormal; Notable for the following components:      Result Value   Streptococcus, Group A Screen (Direct) POSITIVE (*)    All other components within normal limits    EKG   Radiology No results found.  Procedures Procedures (including critical care time)  Medications Ordered in UC Medications  penicillin g benzathine (BICILLIN LA) 1200000 UNIT/2ML injection 1.2 Million Units (has no administration in time range)    Initial Impression / Assessment and Plan / UC Course  I have reviewed the triage vital signs and the nursing notes.  Pertinent labs & imaging results that were available during my care of the patient were reviewed by me and considered in my medical decision making (see chart for details).   Strep pharyngitis.  Rapid strep positive.  Treated with Bicillin LA.  Treating fever and discomfort with ibuprofen.  Instructed patient to follow-up with his PCP if his symptoms are not improving.  He agrees to plan of care.   Final Clinical Impressions(s) / UC Diagnoses   Final diagnoses:  Strep pharyngitis     Discharge Instructions     You were given an injection of penicillin today to treat your strep throat.  No additional antibiotic is needed.    Take Tylenol or ibuprofen as needed for discomfort or fever.    Follow up with your  primary care provider if your symptoms are not improving.        ED Prescriptions    Medication Sig Dispense Auth. Provider   ibuprofen (  ADVIL) 600 MG tablet Take 1 tablet (600 mg total) by mouth every 6 (six) hours as needed. 30 tablet Mickie Bail, NP     PDMP not reviewed this encounter.   Mickie Bail, NP 08/05/20 [phone]

## 2020-08-05 NOTE — ED Triage Notes (Signed)
Patient c/o RT sided sore throat and fever x 1 week.   Patient endorses a temperature of 101.3 F at home.   Patient endorses painful swallowing and drainage from tonsil "yellow".   Patient hasn't taken any medications at home.

## 2020-08-05 NOTE — Discharge Instructions (Addendum)
You were given an injection of penicillin today to treat your strep throat.  No additional antibiotic is needed.    Take Tylenol or ibuprofen as needed for discomfort or fever.    Follow up with your primary care provider if your symptoms are not improving.

## 2024-01-30 ENCOUNTER — Encounter (HOSPITAL_COMMUNITY): Payer: Self-pay | Admitting: Emergency Medicine

## 2024-01-30 ENCOUNTER — Other Ambulatory Visit: Payer: Self-pay

## 2024-01-30 ENCOUNTER — Emergency Department (HOSPITAL_COMMUNITY)
Admission: EM | Admit: 2024-01-30 | Discharge: 2024-01-30 | Disposition: A | Discharge status: Home / Self Care | Payer: Self-pay | Attending: Student in an Organized Health Care Education/Training Program | Admitting: Student in an Organized Health Care Education/Training Program

## 2024-01-30 DIAGNOSIS — I10 Essential (primary) hypertension: Secondary | ICD-10-CM | POA: Insufficient documentation

## 2024-01-30 DIAGNOSIS — Z79899 Other long term (current) drug therapy: Secondary | ICD-10-CM | POA: Insufficient documentation

## 2024-01-30 DIAGNOSIS — L739 Follicular disorder, unspecified: Secondary | ICD-10-CM | POA: Insufficient documentation

## 2024-01-30 MED ORDER — DOXYCYCLINE HYCLATE 100 MG PO TABS
100.0000 mg | ORAL_TABLET | Freq: Once | ORAL | Status: AC
  Administered 2024-01-30: 100 mg via ORAL
  Filled 2024-01-30: qty 1

## 2024-01-30 MED ORDER — MUPIROCIN CALCIUM 2 % EX CREA: 1.0000 | TOPICAL_CREAM | Freq: Two times a day (BID) | CUTANEOUS | 0 refills | Status: DC

## 2024-01-30 MED ORDER — DOXYCYCLINE HYCLATE 100 MG PO CAPS: 100.0000 mg | ORAL_CAPSULE | Freq: Two times a day (BID) | ORAL | 0 refills | Status: DC

## 2024-01-30 NOTE — ED Provider Notes (Signed)
 [facility] Provider Note   CSN: [id number] Arrival date & time: 01/30/24  1348     Patient presents with: Wound Infection   [name] is a 33 y.o. male past medical history significant for hypertension presents today for bilateral lower extremity wounds x 1 week.  Patient also reports a subjective fever this morning.  Patient reports that his hair follicles get indurated and then become pustules and then pop.  Patient denies nausea, vomiting, numbness, weakness, red streaking of his legs, or any other complaints at this time.   HPI     Prior to Admission medications   Medication Sig Start Date End Date Taking? Authorizing Provider  doxycycline  (VIBRAMYCIN ) 100 MG capsule Take 1 capsule (100 mg total) by mouth 2 (two) times daily. 01/30/24  Yes Kody Vigil N, PA-C  mupirocin  cream (BACTROBAN ) 2 % Apply 1 Application topically 2 (two) times daily. 01/30/24  Yes Haniah Penny N, PA-C  cephALEXin  (KEFLEX ) 500 MG capsule Take 1 capsule (500 mg total) by mouth 4 (four) times daily. Patient not taking: No sig reported 09/08/14   Pisciotta, Nat, PA-C  ibuprofen  (ADVIL ) 600 MG tablet Take 1 tablet (600 mg total) by mouth every 6 (six) hours as needed. 08/05/20   Corlis Burnard DEL, NP    Allergies: Patient has no known allergies.    Review of Systems  Skin:  Positive for wound.    Updated Vital Signs BP (!) 153/80 (BP Location: Right Arm)   Pulse 62   Temp 98.1 F (36.7 C) (Oral)   Resp 15   SpO2 100%   Physical Exam Vitals and nursing note reviewed.  Constitutional:      General: He is not in acute distress.    Appearance: He is well-developed.  HENT:     Head: Normocephalic and atraumatic.     Nose: Nose normal.     Mouth/Throat:     Mouth: Mucous membranes are moist.  Eyes:     Conjunctiva/sclera: Conjunctivae normal.  Cardiovascular:     Rate and Rhythm: Normal rate and regular rhythm.     Pulses: Normal pulses.      Heart sounds: Normal heart sounds. No murmur heard. Pulmonary:     Effort: Pulmonary effort is normal. No respiratory distress.     Breath sounds: Normal breath sounds.  Abdominal:     Palpations: Abdomen is soft.     Tenderness: There is no abdominal tenderness.  Musculoskeletal:        General: No swelling.     Cervical back: Neck supple.  Skin:    General: Skin is warm and dry.     Capillary Refill: Capillary refill takes less than 2 seconds.     Findings: Rash present.     Comments: Patient with folliculitis at various stages of healing on bilateral posterior calves.  No area of obvious fluctuance.  No erythema streaking up the legs, numbness, or weakness on exam.  Neurological:     General: No focal deficit present.     Mental Status: He is alert and oriented to person, place, and time.     Sensory: No sensory deficit.  Psychiatric:        Mood and Affect: Mood normal.     (all labs ordered are listed, but only abnormal results are displayed) Labs Reviewed - No data to display  EKG: None  Radiology: No results found.   Procedures   Medications Ordered in the ED  doxycycline  (VIBRA -TABS) tablet 100 mg (has no administration in time range)                                    Medical Decision Making  This patient presents to the ED for concern of rash/wound differential diagnosis includes folliculitis, abscess, cellulitis, contact dermatitis, eczema   Medicines ordered and prescription drug management:  I ordered medication including doxycycline     I have reviewed the patients home medicines and have made adjustments as needed   Problem List / ED Course:  Considered for admission further workup however patient's vital signs and physical exam are reassuring.  Patient's symptoms likely due to folliculitis.  Patient given outpatient course of oral and topical antibiotic.  Patient given return precautions.  I feel patient safe for discharge at this time.        Final diagnoses:  Folliculitis    ED Discharge Orders          Ordered    doxycycline  (VIBRAMYCIN ) 100 MG capsule  2 times daily        01/30/24 1438    mupirocin  cream (BACTROBAN ) 2 %  2 times daily        01/30/24 1438               Francis Ileana SAILOR, PA-C 01/30/24 1442    Lowther, Amy, DO 02/02/24 [phone]

## 2024-01-30 NOTE — ED Triage Notes (Signed)
 Pt reports wounds/sores on bilateral lower legs x 1 weeks. Does report fever this morning.

## 2024-01-30 NOTE — Discharge Instructions (Signed)
 Today you were seen for folliculitis.  Please pick up your medications and take as prescribed.  Please return to the ED if you have fever that does not go down with Tylenol  or Motrin , streaking redness up your legs, or worsening symptoms.  Thank you for letting us  treat you today. After performing a physical exam, I feel you are safe to go home. Please follow up with your PCP in the next several days and provide them with your records from this visit. Return to the Emergency Room if pain becomes severe or symptoms worsen.

## 2024-08-03 ENCOUNTER — Encounter (HOSPITAL_COMMUNITY): Payer: Self-pay | Admitting: Emergency Medicine

## 2024-08-03 ENCOUNTER — Ambulatory Visit (HOSPITAL_COMMUNITY)
Admission: EM | Admit: 2024-08-03 | Discharge: 2024-08-03 | Disposition: A | Discharge status: Home / Self Care | Attending: Emergency Medicine | Admitting: Emergency Medicine

## 2024-08-03 ENCOUNTER — Ambulatory Visit (INDEPENDENT_AMBULATORY_CARE_PROVIDER_SITE_OTHER)

## 2024-08-03 DIAGNOSIS — J101 Influenza due to other identified influenza virus with other respiratory manifestations: Secondary | ICD-10-CM

## 2024-08-03 DIAGNOSIS — R509 Fever, unspecified: Secondary | ICD-10-CM | POA: Diagnosis not present

## 2024-08-03 LAB — POCT INFLUENZA A/B
Influenza A, POC: NEGATIVE
Influenza B, POC: POSITIVE — AB

## 2024-08-03 MED ORDER — PREDNISONE 20 MG PO TABS: 40.0000 mg | ORAL_TABLET | Freq: Every day | ORAL | 0 refills | Status: AC

## 2024-08-03 NOTE — ED Provider Notes (Signed)
 " [facility]    CSN: [id number] Arrival date & time: 08/03/24  1041      History   Chief Complaint Chief Complaint  Patient presents with   Fever   Nasal Congestion    HPI [name] is a 34 y.o. male.   Patient presents to clinic over concern of cough, congestion, body aches and feeling unwell for the past week.  Over the past 4 days he has developed high fevers.  Cough and congestion x1 week  Theraflu around 3 am before going to work today Recent sick exposures at work  Fever for the past 4 days or so  Reports a lot of wheezing and shortness of breath, symptoms are worse when he tries to get ready for work  Does not smoke or drink alcohol   The history is provided by the patient and medical records.  Fever   Past Medical History:  Diagnosis Date   Hypertension     There are no active problems to display for this patient.   History reviewed. No pertinent surgical history.     Home Medications    Prior to Admission medications  Medication Sig Start Date End Date Taking? Authorizing Provider  predniSONE  (DELTASONE ) 20 MG tablet Take 2 tablets (40 mg total) by mouth daily for 5 days. 08/03/24 08/08/24 Yes Ball, Anysha Frappier  N, FNP    Family History Family History  Problem Relation Age of Onset   Hypertension Brother    Hypertension Other     Social History Social History[1]   Allergies   Patient has no known allergies.   Review of Systems Review of Systems  Per HPI  Physical Exam Triage Vital Signs ED Triage Vitals  Encounter Vitals Group     BP 08/03/24 1113 122/73     Girls Systolic BP Percentile --      Girls Diastolic BP Percentile --      Boys Systolic BP Percentile --      Boys Diastolic BP Percentile --      Pulse Rate 08/03/24 1113 (!) 57     Resp 08/03/24 1113 14     Temp 08/03/24 1113 99.1 F (37.3 C)     Temp Source 08/03/24 1113 Oral     SpO2 08/03/24 1113 98 %     Weight --      Height --      Head  Circumference --      Peak Flow --      Pain Score 08/03/24 1112 7     Pain Loc --      Pain Education --      Exclude from Growth Chart --    No data found.  Updated Vital Signs BP 122/73 (BP Location: Right Arm)   Pulse (!) 57   Temp 99.1 F (37.3 C) (Oral)   Resp 14   SpO2 98%   Visual Acuity Right Eye Distance:   Left Eye Distance:   Bilateral Distance:    Right Eye Near:   Left Eye Near:    Bilateral Near:     Physical Exam Vitals and nursing note reviewed.  Constitutional:      Appearance: Normal appearance.  HENT:     Head: Normocephalic and atraumatic.     Right Ear: External ear normal.     Left Ear: External ear normal.     Nose: Nose normal.     Mouth/Throat:     Mouth: Mucous membranes are moist.  Eyes:  Conjunctiva/sclera: Conjunctivae normal.  Cardiovascular:     Rate and Rhythm: Normal rate and regular rhythm.     Heart sounds: Normal heart sounds. No murmur heard. Pulmonary:     Effort: Pulmonary effort is normal. No respiratory distress.     Breath sounds: Normal breath sounds.  Skin:    General: Skin is warm and dry.  Neurological:     General: No focal deficit present.     Mental Status: He is alert.  Psychiatric:        Mood and Affect: Mood normal.      UC Treatments / Results  Labs (all labs ordered are listed, but only abnormal results are displayed) Labs Reviewed  POCT INFLUENZA A/B - Abnormal; Notable for the following components:      Result Value   Influenza B, POC Positive (*)    All other components within normal limits    EKG   Radiology No results found.  Procedures Procedures (including critical care time)  Medications Ordered in UC Medications - No data to display  Initial Impression / Assessment and Plan / UC Course  I have reviewed the triage vital signs and the nursing notes.  Pertinent labs & imaging results that were available during my care of the patient were reviewed by me and considered in my  medical decision making (see chart for details).  Vitals and triage reviewed, patient is hemodynamically stable. Lungs vesicular, heart w/ RRR.   Influenza B testing positive. Patient endorses wheezing and SOB x1 week, CXR by my interpretation does not show obvious infiltrate.  Will treat for acute bronchitis with wheezing and shortness of breath with prednisone  burst.  Symptomatic management for viral illness discussed.  Plan of care, follow-up care and return precautions given, no questions at this time.  Work note provided.    Final Clinical Impressions(s) / UC Diagnoses   Final diagnoses:  Fever, unspecified  Influenza B     Discharge Instructions      He tested positive for influenza B, a viral illness that typically last 5 to 7 days.  Please alternate between and 800 mg of ibuprofen  and 500 mg of Tylenol  every 4-6 hours to help with any fever, body aches or chills.  Chest x-ray did not reveal any pneumonia.  Take the steroid burst to help with any wheezing or shortness of breath, this should treat bronchitis or inflammation of the small airways.  Ensure you are staying hydrated and getting plenty of rest.  If you do not feel any better despite our treatment or if you develop any new concerning symptoms please seek follow-up care.        ED Prescriptions     Medication Sig Dispense Auth. Provider   predniSONE  (DELTASONE ) 20 MG tablet Take 2 tablets (40 mg total) by mouth daily for 5 days. 10 tablet Ball, Ellyssa Zagal  N, FNP      PDMP not reviewed this encounter.     [1]  Social History Tobacco Use   Smoking status: Never   Smokeless tobacco: Never  Substance Use Topics   Alcohol use: No   Drug use: No     [name] Rondinelli  N, FNP 08/03/24 1232  "

## 2024-08-03 NOTE — ED Triage Notes (Signed)
 Pt reports for 4 days had cough, congestion, muscle aches and fevers of 113. Reports took Theraflu.

## 2024-08-03 NOTE — Discharge Instructions (Addendum)
 He tested positive for influenza B, a viral illness that typically last 5 to 7 days.  Please alternate between and 800 mg of ibuprofen  and 500 mg of Tylenol  every 4-6 hours to help with any fever, body aches or chills.  Chest x-ray did not reveal any pneumonia.  Take the steroid burst to help with any wheezing or shortness of breath, this should treat bronchitis or inflammation of the small airways.  Ensure you are staying hydrated and getting plenty of rest.  If you do not feel any better despite our treatment or if you develop any new concerning symptoms please seek follow-up care.
